# Patient Record
Sex: Male | Born: 1980 | Race: Black or African American | Hispanic: No | Marital: Married | Smoking: Never smoker
Health system: Southern US, Community
[De-identification: ages and names within clinical notes are randomized; demographics above are authoritative.]

## PROBLEM LIST (undated history)

## (undated) DIAGNOSIS — L659 Nonscarring hair loss, unspecified: Secondary | ICD-10-CM

## (undated) HISTORY — PX: ESOPHAGUS SURGERY: SHX626

## (undated) HISTORY — DX: Nonscarring hair loss, unspecified: L65.9

---

## 2003-09-01 ENCOUNTER — Emergency Department (HOSPITAL_COMMUNITY): Admission: EM | Admit: 2003-09-01 | Discharge: 2003-09-02 | Payer: Self-pay | Admitting: Emergency Medicine

## 2004-07-02 ENCOUNTER — Emergency Department (HOSPITAL_COMMUNITY): Admission: EM | Admit: 2004-07-02 | Discharge: 2004-07-03 | Payer: Self-pay | Admitting: Emergency Medicine

## 2005-07-15 ENCOUNTER — Emergency Department (HOSPITAL_COMMUNITY): Admission: EM | Admit: 2005-07-15 | Discharge: 2005-07-15 | Payer: Self-pay | Admitting: Family Medicine

## 2012-02-08 ENCOUNTER — Encounter: Payer: Self-pay | Admitting: Medical

## 2012-03-10 ENCOUNTER — Encounter: Payer: Self-pay | Admitting: Medical

## 2012-03-10 ENCOUNTER — Telehealth: Payer: Self-pay | Admitting: Family Medicine

## 2012-03-10 ENCOUNTER — Ambulatory Visit (INDEPENDENT_AMBULATORY_CARE_PROVIDER_SITE_OTHER): Payer: 59 | Admitting: Medical

## 2012-03-10 VITALS — BP 130/80 | HR 80 | Temp 97.9°F | Resp 16 | Ht 66.0 in | Wt 174.0 lb

## 2012-03-10 DIAGNOSIS — J310 Chronic rhinitis: Secondary | ICD-10-CM

## 2012-03-10 DIAGNOSIS — S025XXA Fracture of tooth (traumatic), initial encounter for closed fracture: Secondary | ICD-10-CM

## 2012-03-10 DIAGNOSIS — H5462 Unqualified visual loss, left eye, normal vision right eye: Secondary | ICD-10-CM

## 2012-03-10 DIAGNOSIS — Z23 Encounter for immunization: Secondary | ICD-10-CM

## 2012-03-10 DIAGNOSIS — M25512 Pain in left shoulder: Secondary | ICD-10-CM

## 2012-03-10 DIAGNOSIS — B353 Tinea pedis: Secondary | ICD-10-CM

## 2012-03-10 DIAGNOSIS — M25519 Pain in unspecified shoulder: Secondary | ICD-10-CM

## 2012-03-10 DIAGNOSIS — Z0000 Encounter for general adult medical examination without abnormal findings: Secondary | ICD-10-CM

## 2012-03-10 DIAGNOSIS — H5460 Unqualified visual loss, one eye, unspecified: Secondary | ICD-10-CM

## 2012-03-10 LAB — COMPREHENSIVE METABOLIC PANEL
ALT: 16 U/L (ref 0–53)
Alkaline Phosphatase: 44 U/L (ref 39–117)
Creat: 1.11 mg/dL (ref 0.50–1.35)
Glucose, Bld: 92 mg/dL (ref 70–99)
Sodium: 137 mEq/L (ref 135–145)
Total Protein: 7.1 g/dL (ref 6.0–8.3)

## 2012-03-10 LAB — CBC WITH DIFFERENTIAL/PLATELET
Eosinophils Absolute: 0.6 10*3/uL (ref 0.0–0.7)
Eosinophils Relative: 9 % — ABNORMAL HIGH (ref 0–5)
Lymphs Abs: 2.2 10*3/uL (ref 0.7–4.0)
MCHC: 34.8 g/dL (ref 30.0–36.0)
Monocytes Absolute: 0.4 10*3/uL (ref 0.1–1.0)
Monocytes Relative: 7 % (ref 3–12)
RDW: 13.5 % (ref 11.5–15.5)
WBC: 6 10*3/uL (ref 4.0–10.5)

## 2012-03-10 LAB — LIPID PANEL
Cholesterol: 160 mg/dL (ref 0–200)
LDL Cholesterol: 98 mg/dL (ref 0–99)
Triglycerides: 124 mg/dL (ref <150)
VLDL: 25 mg/dL (ref 0–40)

## 2012-03-10 LAB — TSH: TSH: 2.525 u[IU]/mL (ref 0.350–4.500)

## 2012-03-10 MED ORDER — TERBINAFINE HCL 1 % EX CREA: TOPICAL_CREAM | Freq: Two times a day (BID) | CUTANEOUS | Status: DC

## 2012-03-10 MED ORDER — FLUTICASONE PROPIONATE 50 MCG/ACT NA SUSP: 2.0000 | Freq: Every day | NASAL | Status: DC

## 2012-03-10 NOTE — Telephone Encounter (Signed)
Patient has an appointment with Dr. Harriette Bouillon on 03/17/12 @ 915 am. CLS [phone]

## 2012-03-10 NOTE — Addendum Note (Signed)
Addended by: Leretha Dykes L on: 03/10/2012 10:53 AM   Modules accepted: Orders

## 2012-03-10 NOTE — Progress Notes (Signed)
Subjective:   HPI  Dustin Bruce is a 31 y.o. male who presents for a complete physical.  New patient today, no prior PCM since childhood.  Preventative care: Last ophthalmology visit:n/a Last dental visit:few years ago Last colonoscopy:n/a Last prostate exam: n/a Last EKG:n/a Last labs/a:  Prior vaccinations: TD or Tdap:n/a Influenza: declines Pneumococcal:n/a Shingles/Zostavax:n/a  Advanced directive:n/a Health care power of attorney:n/a Living will:yes  Reviewed their medical, surgical, family, social, medication, and allergy history and updated chart as appropriate.  Concerns: 1) gets pains in left arm.  Injured arm playing basketball years ago.  Sometimes the way he lifts or moves arm sometimes gives pain.  2) every few months loses sight in left eye.  Seems grayish, and vision will return gradually over a few minutes.  Denies headaches, dizziness, hearing loss, no numbness and tingling.  Denies chest pain  3) foot concern between toes  History reviewed. No pertinent past medical history.  Past Surgical History  Procedure Date  . Esophagus surgery     age 80;    Family History  Problem Relation Age of Onset  . Cancer Neg Hx   . Heart disease Neg Hx   . Hypertension Neg Hx   . Diabetes Neg Hx   . Stroke Neg Hx     History   Social History  . Marital Status: Married    Spouse Name: N/A    Number of Children: N/A  . Years of Education: N/A   Occupational History  . Not on file.   Social History Main Topics  . Smoking status: Never Smoker   . Smokeless tobacco: Not on file  . Alcohol Use: Yes     Comment: occasionally  . Drug Use: No  . Sexually Active: Not on file   Other Topics Concern  . Not on file   Social History Narrative   Married, has 2 children, ages 25yo and 13yo, works in [employer], exercise - infrequently    No current outpatient prescriptions on file prior to visit.    No Known Allergies   Review of  Systems Constitutional: -fever, -chills, -sweats, -unexpected weight change, -decreased appetite, -fatigue Allergy: +sneezing, -itching, -congestion Dermatology: -changing moles, --rash, -lumps ENT: +runny nose, -ear pain, -sore throat, -hoarseness, -sinus pain, -teeth pain, - ringing in ears, -hearing loss, -nosebleeds Cardiology: -chest pain, -palpitations, -swelling, -difficulty breathing when lying flat, -waking up short of breath Respiratory: -cough, -shortness of breath, -difficulty breathing with exercise or exertion, -wheezing, -coughing up blood Gastroenterology: -abdominal pain, -nausea, -vomiting, -diarrhea, -constipation, -blood in stool, -changes in bowel movement, -difficulty swallowing or eating Hematology: -bleeding, -bruising  Musculoskeletal: -joint aches, +muscle aches, -joint swelling, -back pain, -neck pain, -cramping, -changes in gait Ophthalmology: + vision changes, eye redness, itching, discharge Urology: -burning with urination, -difficulty urinating, -blood in urine, -urinary frequency, -urgency, -incontinence Neurology: -headache, -weakness, -tingling, -numbness, -memory loss, -falls, -dizziness Psychology: -depressed mood, -agitation, -sleep problems     Objective:   Physical Exam  Filed Vitals:   03/10/12 0854  Pulse: 80  Temp: 97.9 F (36.6 C)  Resp: 16    General appearance: alert, no distress, WD/WN, AA male Skin: no worrisome lesions, few benign appearing macules HEENT: normocephalic, conjunctiva/corneas normal, sclerae anicteric, PERRLA, EOMi, nares patent, no discharge or erythema, pharynx normal Oral cavity: MMM, tongue normal, right posterior molars with obvious fractures, moderate plaque Neck: supple, no lymphadenopathy, no thyromegaly, no masses, normal ROM, on bruits Chest: non tender, normal shape and expansion  Heart: RRR, normal S1, S2, no murmurs Lungs: CTA bilaterally, no wheezes, rhonchi, or rales Abdomen: +bs, soft, non tender, non  distended, no masses, no hepatomegaly, no splenomegaly, no bruits Back: non tender, normal ROM, no scoliosis Musculoskeletal: +tender left upper lateral arm, left shoulder with reduced internal ROM, pain with neers and hawkins, pain with empty can test, but otherwise normal ROM of shoulder,  Otherwise upper extremities non tender, no obvious deformity, normal ROM throughout, lower extremities non tender, no obvious deformity, normal ROM throughout Extremities: no edema, no cyanosis, no clubbing Pulses: 2+ symmetric, upper and lower extremities, normal cap refill Neurological: alert, oriented x 3, CN2-12 intact, strength normal upper extremities and lower extremities, sensation normal throughout, DTRs 2+ throughout, no cerebellar signs, gait normal Psychiatric: normal affect, behavior normal, pleasant  GU: normal male external genitalia, circumcised, nontender, no masses, no hernia, no lymphadenopathy Rectal: deferred   Assessment and Plan :    Encounter Diagnoses  Name Primary?  . Routine general medical examination at a health care facility Yes  . Tooth fracture   . Tinea pedis   . Rhinitis, chronic   . Vision loss of left eye   . Need for Tdap vaccination   . Need for influenza vaccination   . Left shoulder pain      Physical exam - discussed healthy lifestyle, diet, exercise, preventative care, vaccinations, and addressed their concerns.    Advised dental f/u for tooth fracture  Tinea pedis - begin Lamisil, wear open toed shoes at home to let them breath.  Recheck 71mo  chronic rhinitis - begin fluticasone  Loss of vision - etiology unclear.  Referral to Dr. Harriette Bouillon.  Neuro exam normal, clock drawing normal.  tdap vaccine, VIS and counseling given  Flu vaccine, VIS and counseling given  Left shoulder pain - likely rotator cuff problem.  Will send for xray, and likely subsequent PT   Follow-up pending labs, referral

## 2012-03-10 NOTE — Patient Instructions (Signed)
Begin foot fungus cream twice daily for the next 2-4 weeks.  Begin fluticasone nasal spray, 2 sprays daily in the morning for nasal congestion.  Use daily.    I recommend [phone] Dr. Dondra Prader for tooth repair  We referred you to eye doctor.  We will send you for xray of the left shoulder.   Preventative Care for Adults, Male       REGULAR HEALTH EXAMS:  A routine yearly physical is a good way to check in with your primary care provider about your health and preventive screening. It is also an opportunity to share updates about your health and any concerns you have, and receive a thorough all-over exam.   Most health insurance companies pay for at least some preventative services.  Check with your health plan for specific coverages.  WHAT PREVENTATIVE SERVICES DO MEN NEED?  Adult men should have their weight and blood pressure checked regularly.   Men age 56 and older should have their cholesterol levels checked regularly.  Beginning at age 55 and continuing to age 51, men should be screened for colorectal cancer.  Certain people should may need continued testing until age 66.  Other cancer screening may include exams for testicular and prostate cancer.  Updating vaccinations is part of preventative care.  Vaccinations help protect against diseases such as the flu.  Lab tests are generally done as part of preventative care to screen for anemia and blood disorders, to screen for problems with the kidneys and liver, to screen for bladder problems, to check blood sugar, and to check your cholesterol level.  Preventative services generally include counseling about diet, exercise, avoiding tobacco, drugs, excessive alcohol consumption, and sexually transmitted infections.    GENERAL RECOMMENDATIONS FOR GOOD HEALTH:  Healthy diet:  Eat a variety of foods, including fruit, vegetables, animal or vegetable protein, such as meat, fish, chicken, and eggs, or beans, lentils, tofu, and  grains, such as rice.  Drink plenty of water daily.  Decrease saturated fat in the diet, avoid lots of red meat, processed foods, sweets, fast foods, and fried foods.  Exercise:  Aerobic exercise helps maintain good heart health. At least 30-40 minutes of moderate-intensity exercise is recommended. For example, a brisk walk that increases your heart rate and breathing. This should be done on most days of the week.   Find a type of exercise or a variety of exercises that you enjoy so that it becomes a part of your daily life.  Examples are running, walking, swimming, water aerobics, and biking.  For motivation and support, explore group exercise such as aerobic class, spin class, Zumba, Yoga,or  martial arts, etc.    Set exercise goals for yourself, such as a certain weight goal, walk or run in a race such as a 5k walk/run.  Speak to your primary care provider about exercise goals.  Disease prevention:  If you smoke or chew tobacco, find out from your caregiver how to quit. It can literally save your life, no matter how long you have been a tobacco user. If you do not use tobacco, never begin.   Maintain a healthy diet and normal weight. Increased weight leads to problems with blood pressure and diabetes.   The Body Mass Index or BMI is a way of measuring how much of your body is fat. Having a BMI above 27 increases the risk of heart disease, diabetes, hypertension, stroke and other problems related to obesity. Your caregiver can help determine your BMI and based  on it develop an exercise and dietary program to help you achieve or maintain this important measurement at a healthful level.  High blood pressure causes heart and blood vessel problems.  Persistent high blood pressure should be treated with medicine if weight loss and exercise do not work.   Fat and cholesterol leaves deposits in your arteries that can block them. This causes heart disease and vessel disease elsewhere in your body.   If your cholesterol is found to be high, or if you have heart disease or certain other medical conditions, then you may need to have your cholesterol monitored frequently and be treated with medication.   Ask if you should have a stress test if your history suggests this. A stress test is a test done on a treadmill that looks for heart disease. This test can find disease prior to there being a problem.  Avoid drinking alcohol in excess (more than two drinks per day).  Avoid use of street drugs. Do not share needles with anyone. Ask for professional help if you need assistance or instructions on stopping the use of alcohol, cigarettes, and/or drugs.  Brush your teeth twice a day with fluoride toothpaste, and floss once a day. Good oral hygiene prevents tooth decay and gum disease. The problems can be painful, unattractive, and can cause other health problems. Visit your dentist for a routine oral and dental check up and preventive care every 6-12 months.   Look at your skin regularly.  Use a mirror to look at your back. Notify your caregivers of changes in moles, especially if there are changes in shapes, colors, a size larger than a pencil eraser, an irregular border, or development of new moles.  Safety:  Use seatbelts 100% of the time, whether driving or as a passenger.  Use safety devices such as hearing protection if you work in environments with loud noise or significant background noise.  Use safety glasses when doing any work that could send debris in to the eyes.  Use a helmet if you ride a bike or motorcycle.  Use appropriate safety gear for contact sports.  Talk to your caregiver about gun safety.  Use sunscreen with a SPF (or skin protection factor) of 15 or greater.  Lighter skinned people are at a greater risk of skin cancer. Don't forget to also wear sunglasses in order to protect your eyes from too much damaging sunlight. Damaging sunlight can accelerate cataract formation.   Practice  safe sex. Use condoms. Condoms are used for birth control and to help reduce the spread of sexually transmitted infections (or STIs).  Some of the STIs are gonorrhea (the clap), chlamydia, syphilis, trichomonas, herpes, HPV (human papilloma virus) and HIV (human immunodeficiency virus) which causes AIDS. The herpes, HIV and HPV are viral illnesses that have no cure. These can result in disability, cancer and death.   Keep carbon monoxide and smoke detectors in your home functioning at all times. Change the batteries every 6 months or use a model that plugs into the wall.   Vaccinations:  Stay up to date with your tetanus shots and other required immunizations. You should have a booster for tetanus every 10 years. Be sure to get your flu shot every year, since 5%-20% of the [place] population comes down with the flu. The flu vaccine changes each year, so being vaccinated once is not enough. Get your shot in the fall, before the flu season peaks.   Other vaccines to consider:  Pneumococcal vaccine  to protect against certain types of pneumonia.  This is normally recommended for adults age 74 or older.  However, adults younger than 34 years old with certain underlying conditions such as diabetes, heart or lung disease should also receive the vaccine.  Shingles vaccine to protect against Varicella Zoster if you are older than age 55, or younger than 33 years old with certain underlying illness.  Hepatitis A vaccine to protect against a form of infection of the liver by a virus acquired from food.  Hepatitis B vaccine to protect against a form of infection of the liver by a virus acquired from blood or body fluids, particularly if you work in health care.  If you plan to travel internationally, check with your local health department for specific vaccination recommendations.  Cancer Screening:  Most routine colon cancer screening begins at the age of 38. On a yearly basis, doctors may provide special  easy to use take-home tests to check for hidden blood in the stool. Sigmoidoscopy or colonoscopy can detect the earliest forms of colon cancer and is life saving. These tests use a small camera at the end of a tube to directly examine the colon. Speak to your caregiver about this at age 37, when routine screening begins (and is repeated every 5 years unless early forms of pre-cancerous polyps or small growths are found).   At the age of 63 men usually start screening for prostate cancer every year. Screening may begin at a younger age for those with higher risk. Those at higher risk include African-Americans or having a family history of prostate cancer. There are two types of tests for prostate cancer:   Prostate-specific antigen (PSA) testing. Recent studies raise questions about prostate cancer using PSA and you should discuss this with your caregiver.   Digital rectal exam (in which your doctor's lubricated and gloved finger feels for enlargement of the prostate through the anus).   Screening for testicular cancer.  Do a monthly exam of your testicles. Gently roll each testicle between your thumb and fingers, feeling for any abnormal lumps. The best time to do this is after a hot shower or bath when the tissues are looser. Notify your caregivers of any lumps, tenderness or changes in size or shape immediately.

## 2012-03-14 ENCOUNTER — Other Ambulatory Visit: Payer: Self-pay | Admitting: Medical

## 2012-03-14 DIAGNOSIS — H5462 Unqualified visual loss, left eye, normal vision right eye: Secondary | ICD-10-CM

## 2012-03-28 ENCOUNTER — Other Ambulatory Visit: Payer: Self-pay | Admitting: Family Medicine

## 2012-03-28 ENCOUNTER — Telehealth: Payer: Self-pay | Admitting: Family Medicine

## 2012-03-28 NOTE — Telephone Encounter (Signed)
I left a message about the patients appointment at [facility] on 04/03/12 @ 200 pm. CLS

## 2012-03-31 ENCOUNTER — Ambulatory Visit
Admission: RE | Admit: 2012-03-31 | Discharge: 2012-03-31 | Disposition: A | Discharge status: Home / Self Care | Payer: 59 | Source: Ambulatory Visit | Attending: Medical | Admitting: Medical

## 2012-03-31 DIAGNOSIS — M25512 Pain in left shoulder: Secondary | ICD-10-CM

## 2012-03-31 DIAGNOSIS — H5462 Unqualified visual loss, left eye, normal vision right eye: Secondary | ICD-10-CM

## 2012-04-03 ENCOUNTER — Other Ambulatory Visit: Payer: 59

## 2012-04-12 ENCOUNTER — Encounter: Payer: Self-pay | Admitting: Medical

## 2012-04-12 ENCOUNTER — Ambulatory Visit (INDEPENDENT_AMBULATORY_CARE_PROVIDER_SITE_OTHER): Payer: 59 | Admitting: Medical

## 2012-04-12 VITALS — BP 130/80 | HR 80 | Temp 98.0°F | Resp 16 | Wt 174.0 lb

## 2012-04-12 DIAGNOSIS — M25512 Pain in left shoulder: Secondary | ICD-10-CM

## 2012-04-12 DIAGNOSIS — M25519 Pain in unspecified shoulder: Secondary | ICD-10-CM

## 2012-04-12 DIAGNOSIS — H5462 Unqualified visual loss, left eye, normal vision right eye: Secondary | ICD-10-CM

## 2012-04-12 DIAGNOSIS — E041 Nontoxic single thyroid nodule: Secondary | ICD-10-CM

## 2012-04-12 DIAGNOSIS — B353 Tinea pedis: Secondary | ICD-10-CM

## 2012-04-12 DIAGNOSIS — J309 Allergic rhinitis, unspecified: Secondary | ICD-10-CM

## 2012-04-12 DIAGNOSIS — H5460 Unqualified visual loss, one eye, unspecified: Secondary | ICD-10-CM

## 2012-04-12 NOTE — Progress Notes (Signed)
Subjective:   HPI  Dustin Bruce is a 31 y.o. male who presents for f/u.  i saw him recently for physical.   He is here to discus labs, studies, the concerns that were raised, and plan.    The main concern was vision loss.  Gets vision loss in left eye every few months this year only.   Has happened a few other times since last visit.  Seems grayish, and vision will return gradually over a few minutes.  Denies headaches, dizziness, hearing loss, no numbness and tingling.  Denies chest pain  He is using the Lamisil for foot fungus.   Has seen significant improvement in nasal congestion on fluticasone.  No past medical history on file.  Family History  Problem Relation Age of Onset  . Cancer Neg Hx   . Heart disease Neg Hx   . Hypertension Neg Hx   . Diabetes Neg Hx   . Stroke Neg Hx       Objective:   Physical Exam  Filed Vitals:   04/12/12 1015  BP: 130/80  Pulse: 80  Temp: 98 F (36.7 C)  Resp: 16    General appearance: alert, no distress, WD/WN, AA male Neck: supple, no lymphadenopathy, left thyroid nodule palpable approx 1 cm somewhat oval, seems somewhat mobile.  Normal ROM Musculoskeletal: +tender left arm anterior humerus/biceps origin, tender left AC joint, mildly limited left internal shoulder ROM, +hawkins on the left, otherwise nontender, negative other special tests, ROM otherwise full Extremities: no edema, no cyanosis, no clubbing Pulses: 2+ symmetric, upper and lower extremities, normal cap refill Neurological: alert, oriented x 3, CN2-12 intact, strength normal upper extremities and lower extremities, sensation normal throughout, DTRs 2+ throughout, no cerebellar signs, gait normal Psychiatric: normal affect, behavior normal, pleasant    Assessment and Plan :    Encounter Diagnoses  Name Primary?  . Vision loss of left eye Yes  . Thyroid nodule   . Tinea pedis   . Left shoulder pain   . Allergic rhinitis      Vision loss, transient, left eye -  neurology referral  Thyroid nodule - will set up for thyroid ultrasound   Tinea pedis - c/t Lamisil, wear open toed shoes at home to let them breath.   Left shoulder pain - demonstrated and discussed shoulder rehab home exercises for the next month to re strengthen  Allergic rhinitis, likely vasomotor rhinitis as well - much improved on fluticasone

## 2012-04-28 ENCOUNTER — Telehealth: Payer: Self-pay | Admitting: Family Medicine

## 2012-04-28 NOTE — Telephone Encounter (Signed)
Patient is aware of his appointment at [facility] on 05/01/12 @ 1115 am for his thyroid U/S. [phone]. CLS

## 2012-05-01 ENCOUNTER — Other Ambulatory Visit: Payer: 59

## 2012-05-05 ENCOUNTER — Other Ambulatory Visit: Payer: 59

## 2013-10-31 ENCOUNTER — Emergency Department (HOSPITAL_COMMUNITY)
Admission: EM | Admit: 2013-10-31 | Discharge: 2013-10-31 | Disposition: A | Discharge status: Home / Self Care | Payer: BC Managed Care – PPO | Attending: Emergency Medicine | Admitting: Emergency Medicine

## 2013-10-31 ENCOUNTER — Emergency Department (HOSPITAL_COMMUNITY): Payer: BC Managed Care – PPO

## 2013-10-31 ENCOUNTER — Encounter (HOSPITAL_COMMUNITY): Payer: Self-pay | Admitting: Emergency Medicine

## 2013-10-31 ENCOUNTER — Emergency Department (HOSPITAL_COMMUNITY)
Admission: EM | Admit: 2013-10-31 | Discharge: 2013-10-31 | Disposition: A | Discharge status: Nursing Home (Includes ICF) | Payer: BC Managed Care – PPO | Source: Home / Self Care | Attending: Emergency Medicine | Admitting: Emergency Medicine

## 2013-10-31 DIAGNOSIS — M542 Cervicalgia: Secondary | ICD-10-CM | POA: Insufficient documentation

## 2013-10-31 DIAGNOSIS — R0602 Shortness of breath: Secondary | ICD-10-CM | POA: Insufficient documentation

## 2013-10-31 DIAGNOSIS — R079 Chest pain, unspecified: Secondary | ICD-10-CM | POA: Insufficient documentation

## 2013-10-31 LAB — CBC
HCT: 45.5 % (ref 39.0–52.0)
Hemoglobin: 15.8 g/dL (ref 13.0–17.0)
MCH: 30 pg (ref 26.0–34.0)
MCHC: 34.7 g/dL (ref 30.0–36.0)
MCV: 86.5 fL (ref 78.0–100.0)
Platelets: 175 10*3/uL (ref 150–400)
RBC: 5.26 MIL/uL (ref 4.22–5.81)
RDW: 12.4 % (ref 11.5–15.5)
WBC: 6.3 10*3/uL (ref 4.0–10.5)

## 2013-10-31 LAB — TROPONIN I: Troponin I: <0.3 ng/mL (ref <0.30)

## 2013-10-31 LAB — BASIC METABOLIC PANEL
Anion gap: 16 — ABNORMAL HIGH (ref 5–15)
BUN: 12 mg/dL (ref 6–23)
CO2: 22 mEq/L (ref 19–32)
Calcium: 9.5 mg/dL (ref 8.4–10.5)
Chloride: 104 mEq/L (ref 96–112)
Creatinine, Ser: 0.97 mg/dL (ref 0.50–1.35)
GFR calc Af Amer: >90 mL/min (ref >90)
GFR calc non Af Amer: >90 mL/min (ref >90)
Glucose, Bld: 88 mg/dL (ref 70–99)
Potassium: 3.8 mEq/L (ref 3.7–5.3)
Sodium: 142 mEq/L (ref 137–147)

## 2013-10-31 MED ORDER — ASPIRIN 81 MG PO CHEW
CHEWABLE_TABLET | ORAL | Status: AC
  Filled 2013-10-31: qty 4

## 2013-10-31 MED ORDER — KETOROLAC TROMETHAMINE 30 MG/ML IJ SOLN
30.0000 mg | Freq: Once | INTRAMUSCULAR | Status: AC
  Administered 2013-10-31: 30 mg via INTRAVENOUS
  Filled 2013-10-31: qty 1

## 2013-10-31 MED ORDER — ASPIRIN 81 MG PO CHEW
CHEWABLE_TABLET | ORAL | Status: AC
  Filled 2013-10-31: qty 1

## 2013-10-31 MED ORDER — CYCLOBENZAPRINE HCL 10 MG PO TABS
10.0000 mg | ORAL_TABLET | Freq: Once | ORAL | Status: AC
  Administered 2013-10-31: 10 mg via ORAL
  Filled 2013-10-31: qty 1

## 2013-10-31 MED ORDER — NITROGLYCERIN 0.4 MG SL SUBL
SUBLINGUAL_TABLET | SUBLINGUAL | Status: AC
  Filled 2013-10-31: qty 1

## 2013-10-31 MED ORDER — ASPIRIN 81 MG PO CHEW
324.0000 mg | CHEWABLE_TABLET | Freq: Once | ORAL | Status: AC
  Administered 2013-10-31: 324 mg via ORAL

## 2013-10-31 MED ORDER — CYCLOBENZAPRINE HCL 10 MG PO TABS: 10.0000 mg | ORAL_TABLET | Freq: Two times a day (BID) | ORAL | Status: DC | PRN

## 2013-10-31 MED ORDER — SODIUM CHLORIDE 0.9 % IV SOLN
Freq: Once | INTRAVENOUS | Status: AC
  Administered 2013-10-31: 16:00:00 via INTRAVENOUS

## 2013-10-31 MED ORDER — TRAMADOL HCL 50 MG PO TABS: 50.0000 mg | ORAL_TABLET | Freq: Four times a day (QID) | ORAL | Status: DC | PRN

## 2013-10-31 MED ORDER — KETOROLAC TROMETHAMINE 60 MG/2ML IM SOLN: 60.0000 mg | Freq: Once | INTRAMUSCULAR | Status: DC

## 2013-10-31 MED ORDER — NITROGLYCERIN 0.4 MG SL SUBL
0.4000 mg | SUBLINGUAL_TABLET | SUBLINGUAL | Status: DC | PRN
Start: 2013-10-31 — End: 2013-10-31

## 2013-10-31 NOTE — ED Notes (Signed)
Pt states L sided neck pain radiating to shoulder and into chest. Onset yesterday. Denies pain at the time. States neck feels sore with movement. Pt denies injury. He is alert and oriented x4. Respirations unlabored.

## 2013-10-31 NOTE — ED Notes (Signed)
[facility] sided neck pain radiating to shoulder and into chest. Ongoing since Tuesday morning. Neck pain increases with movement. Denies recent injury. Pt is alert and oriented x4.

## 2013-10-31 NOTE — ED Notes (Signed)
Notified [facility] 

## 2013-10-31 NOTE — ED Provider Notes (Signed)
CSN: [id number]     Arrival date & time 10/31/13  1556 History   First MD Initiated Contact with Patient 10/31/13 1605     Chief Complaint  Patient presents with  . Shoulder Pain  . Chest Pain     (Consider location/radiation/quality/duration/timing/severity/associated sxs/prior Treatment) HPI Pt is a 33yo male sent to ED from urgent care for further evaluation of c/o intermittent left sided chest pain and SOB associated with left sided neck pain that started yesterday morning. Pt states he woke up with left sided neck pain. Denies hx of trauma.  Per urgent care, neck pain appears muscular in nature, however CP was more concerning to pt and would like to make sure his heart is not the cause of his pain.  Pt states chest pain is "maybe a 1/10" at this time in the ED in center of chest and denies SOB.  Left sided neck pain is constant, aching and sore, worse with head movement and palpation, 6/10 at worst.  Reports taking ibuprofen with minimal relief. Denies fever, n/v/d. Denies personal hx of CAD. Reports FHx of grandfather died from MI at age 35350.  Pt denies any other significant PMH.   History reviewed. No pertinent past medical history. Past Surgical History  Procedure Laterality Date  . Esophagus surgery      age 33;   Family History  Problem Relation Age of Onset  . Cancer Neg Hx   . Heart disease Neg Hx   . Hypertension Neg Hx   . Diabetes Neg Hx   . Stroke Neg Hx    History  Substance Use Topics  . Smoking status: Never Smoker   . Smokeless tobacco: Not on file  . Alcohol Use: Yes     Comment: social    Review of Systems  Constitutional: Negative for fever, chills, diaphoresis and fatigue.  HENT: Negative for congestion and sore throat.   Respiratory: Positive for shortness of breath. Negative for cough, wheezing and stridor.   Cardiovascular: Positive for chest pain. Negative for palpitations and leg swelling.  Gastrointestinal: Negative for nausea, vomiting, abdominal  pain and diarrhea.  All other systems reviewed and are negative.     Allergies  Review of patient's allergies indicates no known allergies.  Home Medications   Prior to Admission medications   Medication Sig Start Date End Date Taking? Authorizing Provider  ibuprofen (ADVIL,MOTRIN) 200 MG tablet Take 400 mg by mouth every 6 (six) hours as needed for mild pain.   Yes Historical Provider, MD  cyclobenzaprine (FLEXERIL) 10 MG tablet Take 1 tablet (10 mg total) by mouth 2 (two) times daily as needed for muscle spasms. 10/31/13   Junius FinnerErin O'Malley, PA-C  traMADol (ULTRAM) 50 MG tablet Take 1 tablet (50 mg total) by mouth every 6 (six) hours as needed. 10/31/13   Junius FinnerErin O'Malley, PA-C   BP 132/84  Pulse 66  Temp(Src) 97.9 F (36.6 C) (Oral)  Resp 15  SpO2 99% Physical Exam  Nursing note and vitals reviewed. Constitutional: He appears well-developed and well-nourished.  Pt lying in exam bed, appears mildly anxious  HENT:  Head: Normocephalic and atraumatic.  Eyes: Conjunctivae are normal. No scleral icterus.  Neck: Normal range of motion. Neck supple. No JVD present. No tracheal deviation present. No thyromegaly present.  No midline spinal tenderness. No meningeal signs. Tenderness in left paraspinal muscles   Cardiovascular: Normal rate, regular rhythm and normal heart sounds.   Pulmonary/Chest: Effort normal and breath sounds normal. No stridor. No respiratory  distress. He has no wheezes. He has no rales. He exhibits no tenderness.  No respiratory distress, able to speak in full sentences w/o difficulty. Lungs: CTAB. No chest wall tenderness   Abdominal: Soft. Bowel sounds are normal. He exhibits no distension and no mass. There is no tenderness. There is no rebound and no guarding.  Musculoskeletal: Normal range of motion. He exhibits tenderness. He exhibits no edema.  Tenderness in left upper trapezius. No shoulder deformity. FROM left arm.   Lymphadenopathy:    He has no cervical  adenopathy.  Neurological: He is alert.  Skin: Skin is warm and dry.    ED Course  Procedures (including critical care time) Labs Review Labs Reviewed  BASIC METABOLIC PANEL - Abnormal; Notable for the following:    Anion gap 16 (*)    All other components within normal limits  CBC  TROPONIN I    Imaging Review Dg Chest 2 View  10/31/2013   CLINICAL DATA:  Left chest pain radiating to the left shoulder, some shortness of breath  EXAM: CHEST  2 VIEW  COMPARISON:  Chest x-ray of 07/03/2004  FINDINGS: No active infiltrate or effusion is seen. There is mild peribronchial thickening present which may indicate bronchitis. Mediastinal and hilar contours are unremarkable. The heart is within normal limits in size. No bony abnormality is seen.  IMPRESSION: No active infiltrate or effusion. Mild peribronchial thickening may indicate bronchitis.   Electronically Signed   By: Dwyane DeePaul  Barry M.D.   On: 10/31/2013 17:19     EKG Interpretation None      MDM   Final diagnoses:  Neck pain on left side  Chest pain, unspecified chest pain type    Pt is a 33yo male sent to ED for further evaluation of neck pain, with chest pain and SOB.  While in ED pt states chest pain is "maybe a 1/10" and denies SOB.  Pt states neck pain still present.  On exam, neck pain is reproducible with palpation and head movements.  Likely muscular in nature.  No chest wall tenderness. No respiratory distress. Lungs: CTAB.  Low concern for ACS or PE.  EKG from urgent care reviewed: NSR, normal EKG.  Troponin: negative for elevation. CBC and BMP: WNL.  Pt given flexerion and toradol which did help neck pain some, pain dropped from 6/10 to 3/10.  Not concerned for emergent process taking place at this time.  Will discharge pt home to f/u with PCP. Return precautions provided. Pt verbalized understanding and agreement with tx plan.      Junius Finnerrin O'Malley, PA-C 11/01/13 1535

## 2013-10-31 NOTE — ED Notes (Addendum)
Left neck pain, limiting the turning of his head.  Onset Tuesday am when he woke.  Pain radiates into top of left chest and across left should.  Pain not as bad today as yesterday.  Patient reports chest pain was worse and felt some sob, unable to get comfortable, and then fell asleep

## 2013-10-31 NOTE — ED Provider Notes (Signed)
CSN: [id number]     Arrival date & time 10/31/13  1206 History   First MD Initiated Contact with Patient 10/31/13 1357     Chief Complaint  Patient presents with  . Neck Pain   (Consider location/radiation/quality/duration/timing/severity/associated sxs/prior Treatment) HPI Comments: Patient presents stating he woke with left lateral neck pain on the morning of 10/30/2013. States pain persisted throughout the day on 7/7 and on the afternoon of 7/7 he then developed left shoulder pain and left anterior chest pain with associated dyspnea. States neck pain has persisted and is exacerbated by movement of his head or turning head from side to side. States chest pain has waxed and waned over the past 24 hours and does not seem to change with movement of his head, neck, torso or upper extremities. States when chest pain intensifies, he develops dyspnea. Cannot determine if chest symptoms are exacerbated by exertion or if they improve with rest. Denies fever or cough. Denies recent injury. Has been taking OTC ibuprofen with little relief.  [facility] Does not smoke, drink alcohol or use illicit drugs. Fhx: father with DM, grandfather died of fatal MI at age 35  The history is provided by the patient.    History reviewed. No pertinent past medical history. Past Surgical History  Procedure Laterality Date  . Esophagus surgery      age 68;   Family History  Problem Relation Age of Onset  . Cancer Neg Hx   . Heart disease Neg Hx   . Hypertension Neg Hx   . Diabetes Neg Hx   . Stroke Neg Hx    History  Substance Use Topics  . Smoking status: Never Smoker   . Smokeless tobacco: Not on file  . Alcohol Use: Yes     Comment: occasionally    Review of Systems  Constitutional: Negative.   HENT: Negative.   Eyes: Negative.   Respiratory: Positive for chest tightness and shortness of breath. Negative for cough and wheezing.   Cardiovascular: Positive for chest pain. Negative for  palpitations and leg swelling.  Gastrointestinal: Negative.   Genitourinary: Negative.   Musculoskeletal: Positive for neck pain and neck stiffness.  Skin: Negative.   Neurological: Negative.     Allergies  Review of patient's allergies indicates no known allergies.  Home Medications   Prior to Admission medications   Medication Sig Start Date End Date Taking? Authorizing Provider  fluticasone (FLONASE) 50 MCG/ACT nasal spray Place 2 sprays into the nose daily. 03/10/12   Kermit Baloavid S Tysinger, PA-C  terbinafine (LAMISIL AT) 1 % cream Apply topically 2 (two) times daily. 03/10/12   Kermit Baloavid S Tysinger, PA-C   BP 135/90  Pulse 78  Temp(Src) 98.1 F (36.7 C) (Oral)  Resp 14  SpO2 100% Physical Exam  Nursing note and vitals reviewed. Constitutional: He is oriented to person, place, and time. He appears well-developed and well-nourished. No distress.  HENT:  Head: Normocephalic and atraumatic.  Eyes: Conjunctivae are normal.  Neck: Normal range of motion and phonation normal. Neck supple. No JVD present. Muscular tenderness present. No spinous process tenderness present. No rigidity. Normal range of motion present.    Outlined area is area of tenderness with palpation and ROM of neck/head  Cardiovascular: Normal rate, regular rhythm and normal heart sounds.   Pulmonary/Chest: Effort normal and breath sounds normal. No respiratory distress. He has no wheezes. He exhibits no tenderness.  Musculoskeletal: Normal range of motion.  Neurological: He is alert and oriented to  person, place, and time.  Skin: Skin is warm and dry. No rash noted. No erythema.  Psychiatric: He has a normal mood and affect. His behavior is normal.    ED Course  Procedures (including critical care time) Labs Review Labs Reviewed - No data to display  Imaging Review No results found.   MDM   1. Chest pain, unspecified chest pain type    ECG: NSR at 71 bpm with normal intervals and no ectopy. No acute ST/T  wave changes. While issues with patient's left lateral neck appear to be musculoskeletal in origin, chest discomfort and associated dyspnea are somewhat concerning. Clinical evidence does not suggest PE. Patient reports that the development of chest discomfort is what made him seek evaluation. States he wants to make sure he is not having trouble with his heart. Therefore, [facility] and additional cardiac risk stratification.   Jess BartersJennifer Lee Wood[place], [place] 10/31/13 1540

## 2013-11-01 NOTE — ED Provider Notes (Signed)
Medical screening examination/treatment/procedure(s) were performed by non-physician practitioner and as supervising physician I was immediately available for consultation/collaboration.  Darleen Moffitt, M.D.  Jasiel Apachito C Aldan Camey, MD 11/01/13 0744 

## 2013-11-03 NOTE — ED Provider Notes (Signed)
Medical screening examination/treatment/procedure(s) were conducted as a shared visit with non-physician practitioner(s) and myself.  I personally evaluated the patient during the encounter.  Pt c/o left lateral neck pain. Worse w movement and palpation area. Spine nt. Chest cta. Rrr.    Suzi RootsKevin E Antrice Pal, MD 11/03/13 [phone]

## 2015-11-12 IMAGING — CR DG CHEST 2V
2 series · 2 of 2 positions shown · non-contrast
Comparison: Chest x-ray of 07/03/2004

CLINICAL DATA: Left chest pain radiating to the left shoulder, some
shortness of breath

EXAM:
CHEST  2 VIEW

[w chest pa]
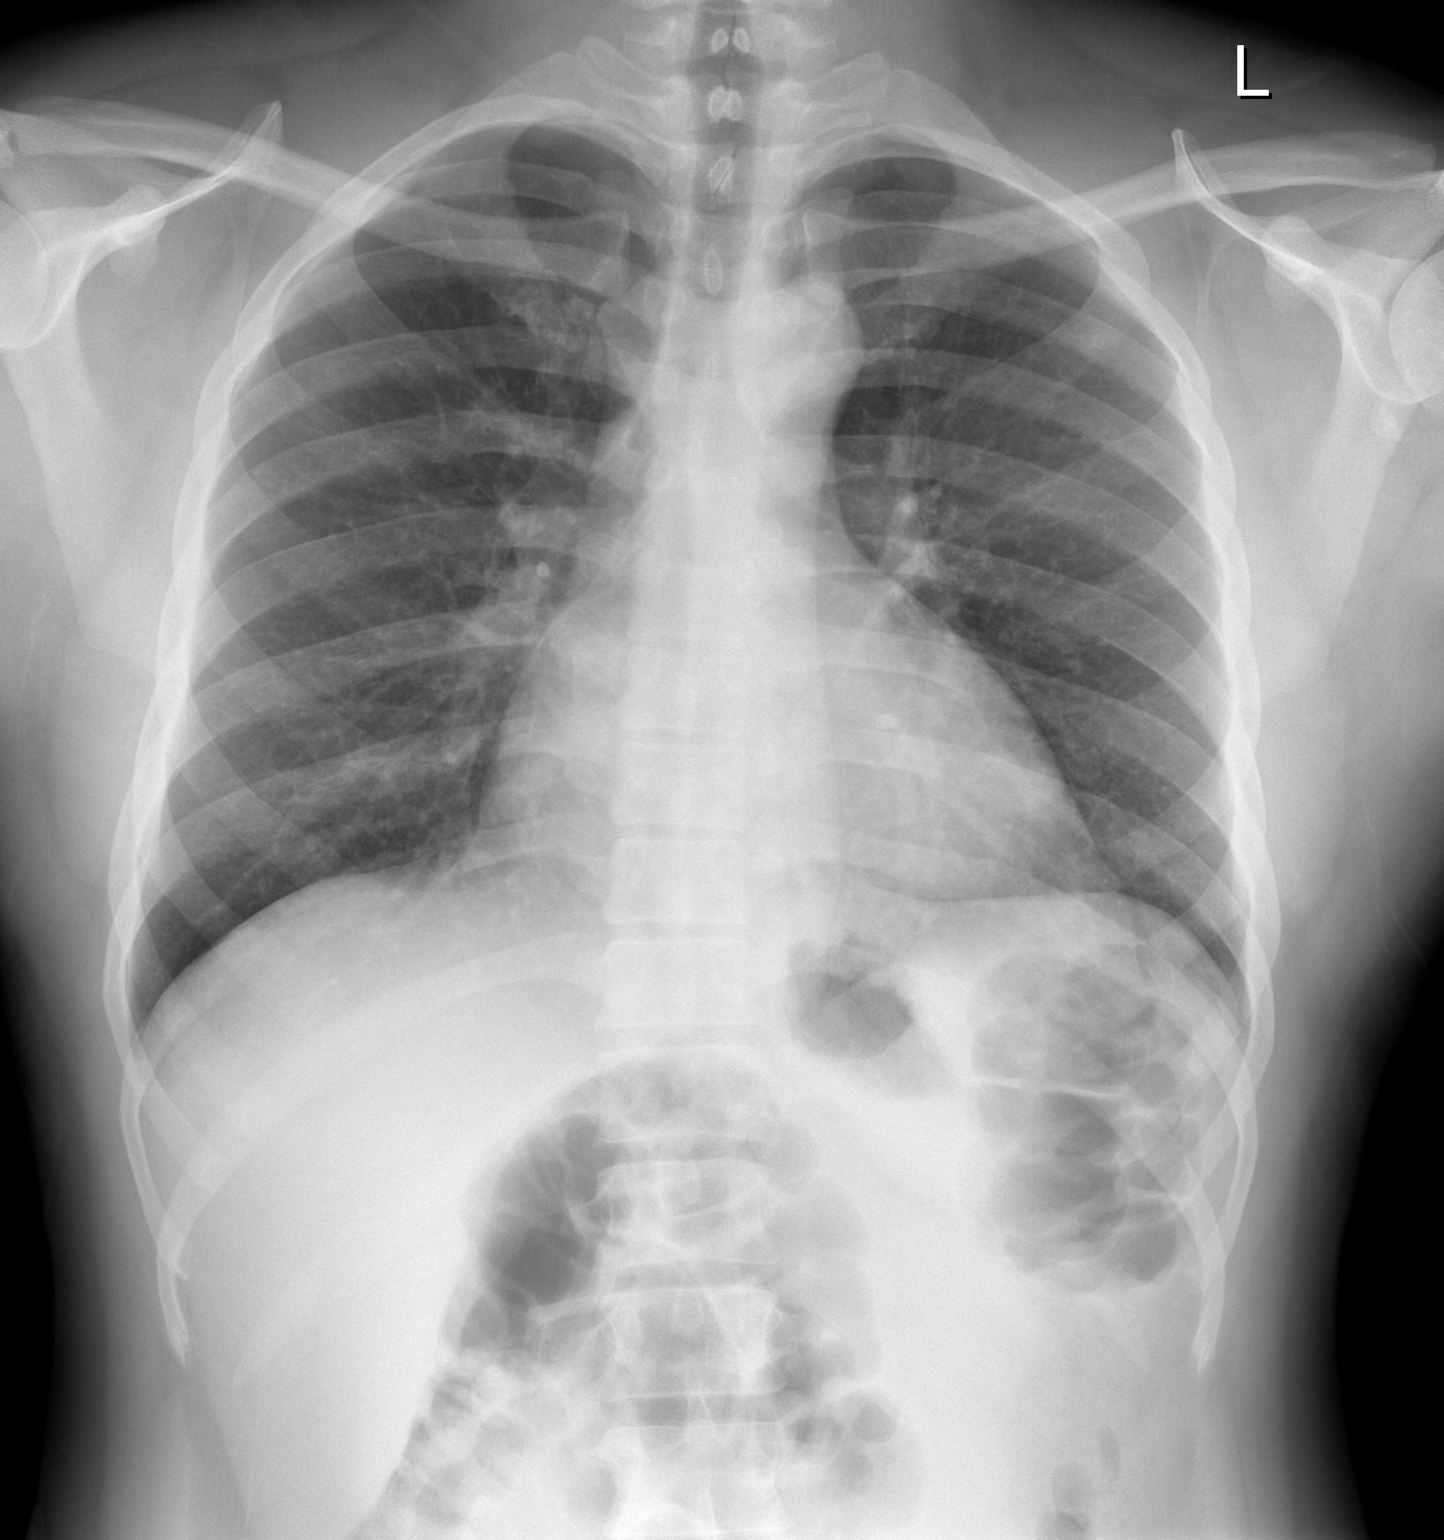

[w chest lat]
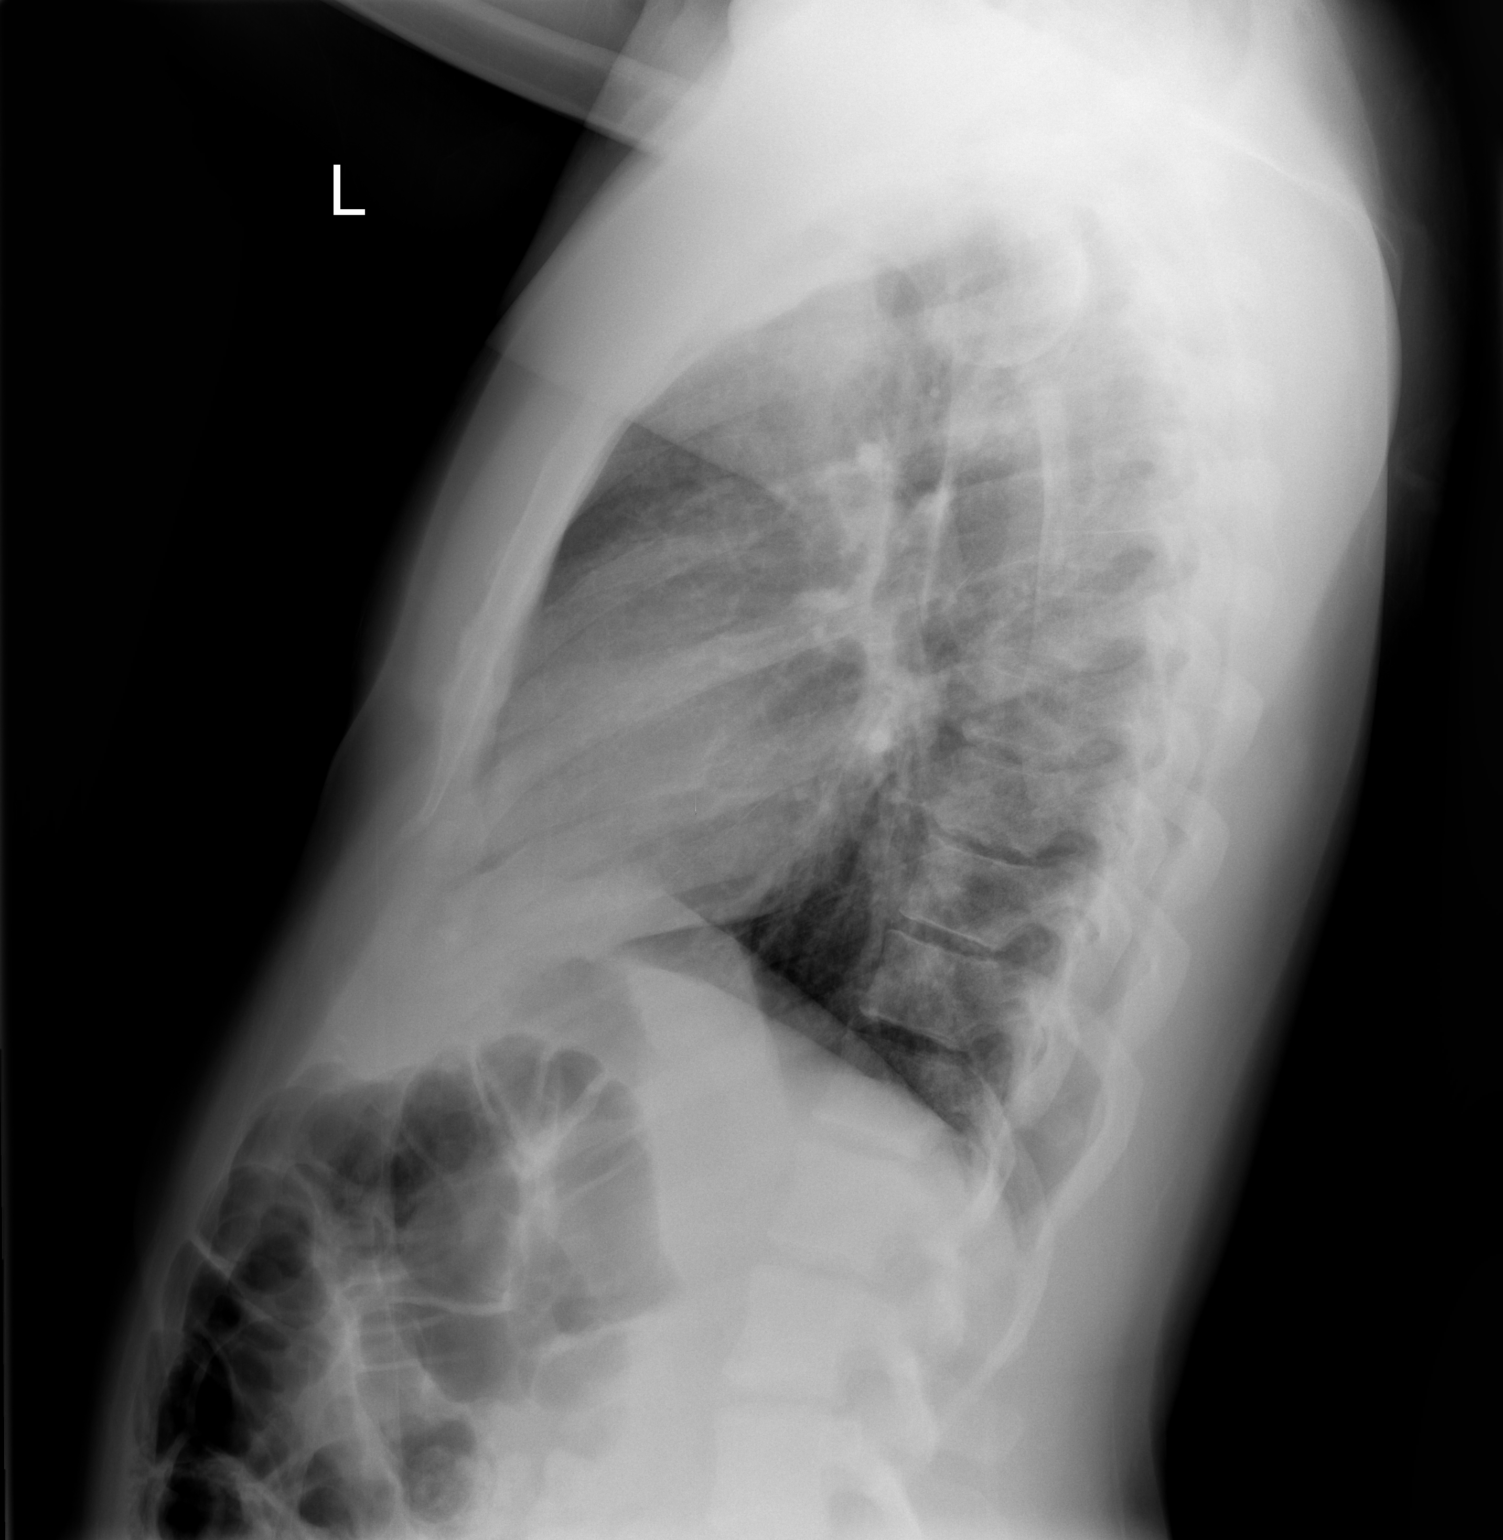

[2 of 2 positions shown; findings below may reference images not displayed]

FINDINGS: No active infiltrate or effusion is seen. There is mild
peribronchial thickening present which may indicate bronchitis.
Mediastinal and hilar contours are unremarkable. The heart is within
normal limits in size. No bony abnormality is seen.
IMPRESSION: No active infiltrate or effusion. Mild peribronchial thickening may
indicate bronchitis.

## 2017-09-07 DIAGNOSIS — Z9109 Other allergy status, other than to drugs and biological substances: Secondary | ICD-10-CM | POA: Insufficient documentation

## 2018-05-25 ENCOUNTER — Ambulatory Visit
Admission: EM | Admit: 2018-05-25 | Discharge: 2018-05-25 | Disposition: A | Discharge status: Home / Self Care | Payer: 59 | Attending: Family Medicine | Admitting: Family Medicine

## 2018-05-25 ENCOUNTER — Encounter: Payer: Self-pay | Admitting: Emergency Medicine

## 2018-05-25 DIAGNOSIS — B349 Viral infection, unspecified: Secondary | ICD-10-CM

## 2018-05-25 DIAGNOSIS — R079 Chest pain, unspecified: Secondary | ICD-10-CM

## 2018-05-25 MED ORDER — CETIRIZINE HCL 10 MG PO CAPS: 10.0000 mg | ORAL_CAPSULE | Freq: Every day | ORAL | 0 refills | Status: DC

## 2018-05-25 MED ORDER — IBUPROFEN 600 MG PO TABS: 600.0000 mg | ORAL_TABLET | Freq: Four times a day (QID) | ORAL | 0 refills | Status: DC | PRN

## 2018-05-25 NOTE — ED Provider Notes (Signed)
[facility]    CSN: [id number] Arrival date & time: 05/25/18  1718     History   Chief Complaint Chief Complaint  Patient presents with  . Flu-Like Symptoms    HPI Dustin Bruce is a 38 y.o. male no contributing past medical history presenting today for evaluation of headache.  Patient states that yesterday morning he woke up with a headache and some mild chest discomfort on his left side.  He took some ibuprofen for this and symptoms are relatively mild the rest of the day.  Today he has had similar symptoms and noted to have a temperature of 99.9.  He is continued to have some slight discomfort in his chest.  He denies any significant cough, states that the cough is mild and very infrequent.  Endorses some mild infrequent sore throat as well as congestion.  He has also had some occasional chills and felt warm.  Denies associated GI upset.  [place] but denies any out of the country travel.  HPI  History reviewed. No pertinent past medical history.  There are no active problems to display for this patient.   Past Surgical History:  Procedure Laterality Date  . ESOPHAGUS SURGERY     age 40;       Home Medications    Prior to Admission medications   Medication Sig Start Date End Date Taking? Authorizing Provider  Cetirizine HCl 10 MG CAPS Take 1 capsule (10 mg total) by mouth daily for 10 days. 05/25/18 06/04/18  Shahram Alexopoulos C, PA-C  ibuprofen (ADVIL,MOTRIN) 600 MG tablet Take 1 tablet (600 mg total) by mouth every 6 (six) hours as needed. 05/25/18   Drevin Ortner, Junius CreamerHallie C, PA-C    Family History Family History  Problem Relation Age of Onset  . Cancer Neg Hx   . Heart disease Neg Hx   . Hypertension Neg Hx   . Diabetes Neg Hx   . Stroke Neg Hx     Social History Social History   Tobacco Use  . Smoking status: Never Smoker  . Smokeless tobacco: Never Used  Substance Use Topics  . Alcohol use: Yes   Comment: social  . Drug use: No     Allergies   Patient has no known allergies.   Review of Systems Review of Systems  Constitutional: Positive for chills, fatigue and fever. Negative for activity change and appetite change.  HENT: Positive for congestion, rhinorrhea and sore throat. Negative for ear pain, sinus pressure and trouble swallowing.   Eyes: Negative for discharge and redness.  Respiratory: Positive for cough. Negative for chest tightness and shortness of breath.   Cardiovascular: Positive for chest pain.  Gastrointestinal: Negative for abdominal pain, diarrhea, nausea and vomiting.  Musculoskeletal: Negative for myalgias.  Skin: Negative for rash.  Neurological: Positive for headaches. Negative for dizziness and light-headedness.     Physical Exam Triage Vital Signs ED Triage Vitals  Enc Vitals Group     BP 05/25/18 1726 136/88     Pulse Rate 05/25/18 1726 (!) 103     Resp 05/25/18 1726 18     Temp 05/25/18 1726 98.2 F (36.8 C)     Temp Source 05/25/18 1726 Oral     SpO2 05/25/18 1726 98 %     Weight --      Height --      Head Circumference --      Peak Flow --  Pain Score 05/25/18 1727 2     Pain Loc --      Pain Edu? --      Excl. in GC? --    No data found.  Updated Vital Signs BP 136/88 (BP Location: Left Arm)   Pulse (!) 103   Temp 98.2 F (36.8 C) (Oral)   Resp 18   SpO2 98%   Visual Acuity Right Eye Distance:   Left Eye Distance:   Bilateral Distance:    Right Eye Near:   Left Eye Near:    Bilateral Near:     Physical Exam Vitals signs and nursing note reviewed.  Constitutional:      Appearance: He is well-developed.  HENT:     Head: Normocephalic and atraumatic.     Ears:     Comments: Bilateral ears without tenderness to palpation of external auricle, tragus and mastoid, EAC's without erythema or swelling, TM's with good bony landmarks and cone of light. Non erythematous.    Nose:     Comments: Nasal mucosa slightly  erythematous    Mouth/Throat:     Comments: Oral mucosa pink and moist, moderate tonsillar enlargement, mild erythema. Posterior pharynx patent and nonerythematous, no uvula deviation or swelling. Normal phonation. Eyes:     Conjunctiva/sclera: Conjunctivae normal.  Neck:     Musculoskeletal: Neck supple.  Cardiovascular:     Rate and Rhythm: Normal rate and regular rhythm.     Heart sounds: No murmur.  Pulmonary:     Effort: Pulmonary effort is normal. No respiratory distress.     Breath sounds: Normal breath sounds.     Comments: Breathing comfortably at rest, CTABL, no wheezing, rales or other adventitious sounds auscultated  Chest discomfort not reproducible on exam Abdominal:     Palpations: Abdomen is soft.     Tenderness: There is no abdominal tenderness.  Skin:    General: Skin is warm and dry.  Neurological:     Mental Status: He is alert.      UC Treatments / Results  Labs (all labs ordered are listed, but only abnormal results are displayed) Labs Reviewed - No data to display  EKG None  Radiology No results found.  Procedures Procedures (including critical care time)  Medications Ordered in UC Medications - No data to display  Initial Impression / Assessment and Plan / UC Course  I have reviewed the triage vital signs and the nursing notes.  Pertinent labs & imaging results that were available during my care of the patient were reviewed by me and considered in my medical decision making (see chart for details).     EKG normal sinus rhythm, does have T wave inversions in isolated lead III, present on previous EKG in 2015 as well.  Patient does have some mild changes of T waves in leads V4 through V6, nonspecific, minimally inverted.  At this time will have patient continue to monitor chest discomfort if worsening or developing new symptoms with this to go to the emergency room.  Do not suspect underlying cardiac event at this time.  Headache and mild  fever, likely viral etiology.  Some mild associated URI symptoms.  Recommending symptomatic and supportive care.  Discussed strict return precautions. Patient verbalized understanding and is agreeable with plan.  Final Clinical Impressions(s) / UC Diagnoses   Final diagnoses:  Viral illness     Discharge Instructions     Symptoms are most likely viral causing low-grade fever and headache Use anti-inflammatories for pain/swelling.  You may take up to 800 mg Ibuprofen every 8 hours with food. You may supplement Ibuprofen with Tylenol [phone] mg every 8 hours.  May consider starting daily cetirizine/Zyrtec or Claritin may get over-the-counter for cheaper to help with congestion and any drainage If you develop more of a cough may try Delsym or Robitussin-DM over-the-counter Drink plenty of fluids Rest Follow-up if symptoms changing or worsening    ED Prescriptions    Medication Sig Dispense Auth. Provider   ibuprofen (ADVIL,MOTRIN) 600 MG tablet Take 1 tablet (600 mg total) by mouth every 6 (six) hours as needed. 30 tablet Reyaansh Merlo C, PA-C   Cetirizine HCl 10 MG CAPS Take 1 capsule (10 mg total) by mouth daily for 10 days. 10 capsule Nailea Whitehorn C, PA-C     Controlled Substance Prescriptions [place] Controlled Substance Registry consulted? Not Applicable   Lew Dawes, [place] 05/25/18 1757

## 2018-05-25 NOTE — Discharge Instructions (Addendum)
Symptoms are most likely viral causing low-grade fever and headache Use anti-inflammatories for pain/swelling. You may take up to 800 mg Ibuprofen every 8 hours with food. You may supplement Ibuprofen with Tylenol [phone] mg every 8 hours.  May consider starting daily cetirizine/Zyrtec or Claritin may get over-the-counter for cheaper to help with congestion and any drainage If you develop more of a cough may try Delsym or Robitussin-DM over-the-counter Drink plenty of fluids Rest Follow-up if symptoms changing or worsening  Please follow-up in the emergency room if having worsening chest pain

## 2018-05-25 NOTE — ED Notes (Signed)
Patient able to ambulate independently  

## 2018-05-25 NOTE — ED Triage Notes (Signed)
[facility] headache starting Monday, feeling hot and chills at work, and today noted a temp of 99.9

## 2018-07-18 ENCOUNTER — Ambulatory Visit
Admission: EM | Admit: 2018-07-18 | Discharge: 2018-07-18 | Disposition: A | Discharge status: Home / Self Care | Payer: 59 | Attending: Physician Assistant | Admitting: Physician Assistant

## 2018-07-18 DIAGNOSIS — R0789 Other chest pain: Secondary | ICD-10-CM | POA: Diagnosis not present

## 2018-07-18 MED ORDER — OMEPRAZOLE 20 MG PO CPDR: 20.0000 mg | DELAYED_RELEASE_CAPSULE | Freq: Every day | ORAL | 0 refills | Status: DC

## 2018-07-18 NOTE — ED Provider Notes (Signed)
[facility]    CSN: [id number] Arrival date & time: 07/18/18  1715     History   Chief Complaint Chief Complaint  Patient presents with  . Chest Pain    HPI Dustin Bruce is a 38 y.o. male.   38 year old male comes in for 1 week history of intermittent left-sided chest pain.  States it feels like a tightness/"gas bubble" that is intermittent, worse with deep breathing.  Sometimes he breathes shallowly to make it feel better.  At first started when he was on the plane to [facility].  He denies any nausea, vomiting, diaphoresis, shortness of breath.  He denies palpitation, weakness, dizziness, syncope.  He denies URI symptoms such as cough, congestion, sore throat.  Denies fever, chills, night sweats.  Denies abdominal pain.  He normally has about 4-5 stools a day ranging from [facility] stool chart of type IV to type V, and has been normal for him.  States given travel, has been eating a lot of fast foods, and drinking more sodas.  He denies exertional fatigue, exertional chest pain, dyspnea on exertion.  He leg swelling, orthopnea.  Denies history of blood clots/DVT.  Denies personal history of hypertension, diabetes, heart disease.  States maternal grandfather passed away from MI in his late 58s.  He denies drug use.  Never smoker.  Occasional alcohol use.  Patient with history of esophagus surgery at age 71.  He does not recall what the procedure was for, states has not had any problems with esophagus since he was a child, and has not needed to follow-up with GI.     History reviewed. No pertinent past medical history.  There are no active problems to display for this patient.   Past Surgical History:  Procedure Laterality Date  . ESOPHAGUS SURGERY     age 18;       Home Medications    Prior to Admission medications   Medication Sig Start Date End Date Taking? Authorizing Provider  Cetirizine HCl 10 MG CAPS Take 1 capsule (10 mg total) by mouth daily for 10  days. 05/25/18 06/04/18  Wieters, Hallie C, PA-C  ibuprofen (ADVIL,MOTRIN) 600 MG tablet Take 1 tablet (600 mg total) by mouth every 6 (six) hours as needed. 05/25/18   Wieters, Hallie C, PA-C  omeprazole (PRILOSEC) 20 MG capsule Take 1 capsule (20 mg total) by mouth daily. 07/18/18   Belinda Fisher, PA-C    Family History Family History  Problem Relation Age of Onset  . Cancer Neg Hx   . Heart disease Neg Hx   . Hypertension Neg Hx   . Diabetes Neg Hx   . Stroke Neg Hx     Social History Social History   Tobacco Use  . Smoking status: Never Smoker  . Smokeless tobacco: Never Used  Substance Use Topics  . Alcohol use: Yes    Comment: social  . Drug use: No     Allergies   Patient has no known allergies.   Review of Systems Review of Systems  Reason unable to perform ROS: See HPI as above.     Physical Exam Triage Vital Signs ED Triage Vitals [07/18/18 1726]  Enc Vitals Group     BP (!) 136/98     Pulse Rate 84     Resp 18     Temp 98.4 F (36.9 C)     Temp Source Oral     SpO2 97 %     Weight  Height      Head Circumference      Peak Flow      Pain Score 4     Pain Loc      Pain Edu?      Excl. in GC?    No data found.  Updated Vital Signs BP (!) 136/98 (BP Location: Left Arm)   Pulse 84   Temp 98.4 F (36.9 C) (Oral)   Resp 18   SpO2 97%   Physical Exam Constitutional:      General: He is not in acute distress.    Appearance: He is well-developed. He is not ill-appearing, toxic-appearing or diaphoretic.  HENT:     Head: Normocephalic and atraumatic.     Right Ear: Tympanic membrane, ear canal and external ear normal. Tympanic membrane is not erythematous or bulging.     Left Ear: Tympanic membrane, ear canal and external ear normal. Tympanic membrane is not erythematous or bulging.     Nose: Nose normal.     Right Sinus: No maxillary sinus tenderness or frontal sinus tenderness.     Left Sinus: No maxillary sinus tenderness or frontal sinus  tenderness.     Mouth/Throat:     Mouth: Mucous membranes are moist.     Pharynx: Oropharynx is clear. Uvula midline.  Eyes:     Conjunctiva/sclera: Conjunctivae normal.     Pupils: Pupils are equal, round, and reactive to light.  Neck:     Musculoskeletal: Normal range of motion and neck supple.  Cardiovascular:     Rate and Rhythm: Normal rate and regular rhythm.     Heart sounds: Normal heart sounds. No murmur. No friction rub. No gallop.   Pulmonary:     Effort: Pulmonary effort is normal. No tachypnea, accessory muscle usage, prolonged expiration, respiratory distress or retractions.     Breath sounds: Normal breath sounds. No stridor, decreased air movement or transmitted upper airway sounds. No decreased breath sounds, wheezing, rhonchi or rales.  Chest:     Chest wall: No tenderness.  Abdominal:     General: A surgical scar is present. Bowel sounds are normal.     Palpations: Abdomen is soft.     Tenderness: There is no abdominal tenderness. There is no right CVA tenderness, left CVA tenderness, guarding or rebound.     Hernia: No hernia is present.  Skin:    General: Skin is warm and dry.  Neurological:     Mental Status: He is alert and oriented to person, place, and time.      UC Treatments / Results  Labs (all labs ordered are listed, but only abnormal results are displayed) Labs Reviewed - No data to display  EKG None  Radiology No results found.  Procedures Procedures (including critical care time)  Medications Ordered in UC Medications - No data to display  Initial Impression / Assessment and Plan / UC Course  I have reviewed the triage vital signs and the nursing notes.  Pertinent labs & imaging results that were available during my care of the patient were reviewed by me and considered in my medical decision making (see chart for details).    No alarming signs on exam.  EKG normal sinus rhythm, 83 bpm, T wave inversion to lead III, unchanged from  prior.  Atypical chest pain that does not seem cardiac related given current history and exam.  Patient without leg swelling, tachycardia, tachypnea, hypoxia, lower suspicion for PE at this time.  Given recent changes  in diet prior to symptom onset, discussed possible GERD contributing to symptoms.  Will start omeprazole as directed.  Discussed foods to avoid at this time.  Return precautions given.  Otherwise follow-up with PCP for further evaluation if symptoms not improving.  Final Clinical Impressions(s) / UC Diagnoses   Final diagnoses:  Atypical chest pain    ED Prescriptions    Medication Sig Dispense Auth. Provider   omeprazole (PRILOSEC) 20 MG capsule Take 1 capsule (20 mg total) by mouth daily. 15 capsule Threasa Alpha, [place] 07/18/18 1819

## 2018-07-18 NOTE — ED Triage Notes (Signed)
Pt c/o lt sided chest pain for a week off and on. Denies pain radiating, SOB and diaphoresis. Pt states feels like a gas pocket in the same spot

## 2018-07-18 NOTE — Discharge Instructions (Addendum)
No alarming signs on exam. Your EKG was unchanged from prior. As discussed, we will try acid reflux medicine to see if it is contributing to the symptoms. Start omeprazole as directed for the next 1-2 weeks. Follow diet change discussed, I have attached some information. Keep hydrated, urine should be clear to pale yellow in color. If experiencing worsening symptoms, sudden worsening chest pain, shortness of breath, weakness, dizziness, one sided leg swelling, sweating, nausea/vomiting, go to the emergency department for further evaluation needed. Otherwise, follow up with PCP for further evaluation and management needed.

## 2019-08-06 ENCOUNTER — Telehealth (INDEPENDENT_AMBULATORY_CARE_PROVIDER_SITE_OTHER): Payer: 59 | Admitting: Internal Medicine

## 2019-08-06 ENCOUNTER — Encounter: Payer: Self-pay | Admitting: Internal Medicine

## 2019-08-06 DIAGNOSIS — Z7689 Persons encountering health services in other specified circumstances: Secondary | ICD-10-CM

## 2019-08-06 NOTE — Progress Notes (Signed)
Virtual Visit via Telephone Note  I connected with Dustin Bruce, on 08/06/2019 at 8:50 AM by telephone due to the COVID-19 pandemic and verified that I am speaking with the correct person using two identifiers.   Consent: I discussed the limitations, risks, security and privacy concerns of performing an evaluation and management service by telephone and the availability of in person appointments. I also discussed with the patient that there may be a patient responsible charge related to this service. The patient expressed understanding and agreed to proceed.   Location of Patient: Home   Location of Provider: Home    Persons participating in Telemedicine visit: Dustin Bruce [facility] Dr. Earlene Plater      History of Present Illness: Patient has a visit to establish care.   Patient had an urgent care visit on 3/22 for atypical chest pain. Patient had benign exam and normal EKG. Thought to be related to GERD and started on Omeprazole. Patient reports he has felt fine since that visit. He actually didn't remember what visit was for. He has not taken PPI since initial Rx was given.   Reports no significant PMH. Had some type of surgery on his esophagus when he was around 39 years old. Has a surgical scar on his stomach.   Is currently using a topical steroid for alopecia.    No past medical history on file. No Known Allergies  No current outpatient medications on file prior to visit.   No current facility-administered medications on file prior to visit.    Observations/Objective: NAD. Speaking clearly.  Work of breathing normal.  Alert and oriented. Mood appropriate.   Assessment and Plan: 1. Encounter to establish care Reviewed PMH and medications with patient. No concerns today. Return for annual exam with screening labs.    Follow Up Instructions: Annual exam    I discussed the assessment and treatment plan with the patient. The patient was provided an  opportunity to ask questions and all were answered. The patient agreed with the plan and demonstrated an understanding of the instructions.   The patient was advised to call back or seek an in-person evaluation if the symptoms worsen or if the condition fails to improve as anticipated.     I provided 14 minutes total of non-face-to-face time during this encounter including median intraservice time, reviewing previous notes, investigations, ordering medications, medical decision making, coordinating care and patient verbalized understanding at the end of the visit.    Marcy Siren, D.O. Primary Care at [facility]  08/06/2019, 8:50 AM

## 2019-08-16 ENCOUNTER — Telehealth: Payer: Self-pay

## 2019-08-16 NOTE — Patient Instructions (Addendum)
Reset your Mychart password: temporary password [phone] You will be notifed of your lab results via Mychart   Keeping you healthy  Get these tests  Blood pressure- Have your blood pressure checked once a year by your healthcare provider.  Normal blood pressure is 120/80.  Weight- Have your body mass index (BMI) calculated to screen for obesity.  BMI is a measure of body fat based on height and weight. You can also calculate your own BMI at https://www.west-esparza.com/.  Cholesterol- Have your cholesterol checked regularly starting at age 9, sooner may be necessary if you have diabetes, high blood pressure, if a family member developed heart diseases at an early age or if you smoke.   Chlamydia, HIV, and other sexual transmitted disease- Get screened each year until the age of 27 then within three months of each new sexual partner.  Diabetes- Have your blood sugar checked regularly if you have high blood pressure, high cholesterol, a family history of diabetes or if you are overweight.  Get these vaccines  Flu shot- Every fall.  Tetanus shot- Every 10 years.  Menactra- Single dose; prevents meningitis.  Take these steps  Don't smoke- If you do smoke, ask your healthcare provider about quitting. For tips on how to quit, go to www.smokefree.gov or call 1-800-QUIT-NOW.  Be physically active- Exercise 5 days a week for at least 30 minutes.  If you are not already physically active start slow and gradually work up to 30 minutes of moderate physical activity.  Examples of moderate activity include walking briskly, mowing the yard, dancing, swimming bicycling, etc.  Eat a healthy diet- Eat a variety of healthy foods such as fruits, vegetables, low fat milk, low fat cheese, yogurt, lean meats, poultry, fish, beans, tofu, etc.  For more information on healthy eating, go to www.thenutritionsource.org  Drink alcohol in moderation- Limit alcohol intake two drinks or less a day.  Never drink and  drive.  Dentist- Brush and floss teeth twice daily; visit your dentis twice a year.  Depression-Your emotional health is as important as your physical health.  If you're feeling down, losing interest in things you normally enjoy please talk with your healthcare provider.  Gun Safety- If you keep a gun in your home, keep it unloaded and with the safety lock on.  Bullets should be stored separately.  Helmet use- Always wear a helmet when riding a motorcycle, bicycle, rollerblading or skateboarding.  Safe sex- If you may be exposed to a sexually transmitted infection, use a condom  Seat belts- Seat bels can save your life; always wear one.  Smoke/Carbon Monoxide detectors- These detectors need to be installed on the appropriate level of your home.  Replace batteries at least once a year.  Skin Cancer- When out in the sun, cover up and use sunscreen SPF 15 or higher.  Violence- If anyone is threatening or hurting you, please tell your healthcare provider.

## 2019-08-16 NOTE — Telephone Encounter (Signed)

## 2019-08-17 ENCOUNTER — Ambulatory Visit (INDEPENDENT_AMBULATORY_CARE_PROVIDER_SITE_OTHER): Payer: 59 | Admitting: Family Medicine

## 2019-08-17 ENCOUNTER — Other Ambulatory Visit: Payer: Self-pay

## 2019-08-17 VITALS — BP 133/76 | HR 58 | Temp 97.3°F | Resp 17 | Ht 67.0 in | Wt 177.0 lb

## 2019-08-17 DIAGNOSIS — Z1389 Encounter for screening for other disorder: Secondary | ICD-10-CM

## 2019-08-17 DIAGNOSIS — Z0000 Encounter for general adult medical examination without abnormal findings: Secondary | ICD-10-CM | POA: Diagnosis not present

## 2019-08-17 DIAGNOSIS — Z131 Encounter for screening for diabetes mellitus: Secondary | ICD-10-CM | POA: Diagnosis not present

## 2019-08-17 DIAGNOSIS — Z1329 Encounter for screening for other suspected endocrine disorder: Secondary | ICD-10-CM

## 2019-08-17 DIAGNOSIS — Z1321 Encounter for screening for nutritional disorder: Secondary | ICD-10-CM

## 2019-08-17 DIAGNOSIS — Z114 Encounter for screening for human immunodeficiency virus [HIV]: Secondary | ICD-10-CM | POA: Diagnosis not present

## 2019-08-17 DIAGNOSIS — Z125 Encounter for screening for malignant neoplasm of prostate: Secondary | ICD-10-CM | POA: Diagnosis not present

## 2019-08-17 LAB — POCT URINALYSIS DIP (CLINITEK)
Bilirubin, UA: NEGATIVE
Blood, UA: NEGATIVE
Glucose, UA: NEGATIVE mg/dL
Ketones, POC UA: NEGATIVE mg/dL
Leukocytes, UA: NEGATIVE
Nitrite, UA: NEGATIVE
POC PROTEIN,UA: NEGATIVE
Spec Grav, UA: 1.02 (ref 1.010–1.025)
Urobilinogen, UA: 0.2 E.U./dL
pH, UA: 6.5 (ref 5.0–8.0)

## 2019-08-17 NOTE — Progress Notes (Signed)
Established Patient Office Visit  Subjective:  Patient ID: Dustin Bruce, male    DOB: November 16, 1980  Age: 39 y.o. MRN: [id number]  CC:  Chief Complaint  Patient presents with  . Annual Exam    patient is fasting    HPI Dustin Bruce presents for routine annual physical. Last physical exam over 1 year ago. Endorses occasional exercise including jogging. He is a non-smoker although endorses exposure to second hand smoke. Receives routine dental care. Recently had 3 teeth extracted and deep clean. Wearing invisalign for teeth straightening. No recent eye exam. Denies changes in visual acuity. Recently seen at urgent care for chest pain which was determined to be related to GERD. He has cut back on caffeine and notifed the symptoms have resolved without medication intervention.  Past Medical History:  Diagnosis Date  . Alopecia     Past Surgical History:  Procedure Laterality Date  . ESOPHAGUS SURGERY     age 57;    Family History  Problem Relation Age of Onset  . Cancer Neg Hx   . Heart disease (died of Heat Attack)  Grandfather  60  . Hypertension Neg Hx   . Diabetes Father   . Stroke Neg Hx     Social History   Socioeconomic History  . Marital status: Married    Spouse name: Not on file  . Number of children: Not on file  . Years of education: Not on file  . Highest education level: Not on file  Occupational History  . Not on file  Tobacco Use  . Smoking status: Never Smoker  . Smokeless tobacco: Never Used  Substance and Sexual Activity  . Alcohol use: Yes    Comment: social  . Drug use: No  . Sexual activity: Not on file  Other Topics Concern  . Not on file  Social History Narrative   Married, has 2 children, ages 81yo and 13yo, works in [employer], exercise - infrequently   Social Determinants of Health   Financial Resource Strain:   . Difficulty of Paying Living Expenses:   Food Insecurity:   . Worried About Programme researcher, broadcasting/film/video  in the Last Year:   . Barista in the Last Year:   Transportation Needs:   . Freight forwarder (Medical):   Marland Kitchen Lack of Transportation (Non-Medical):   Physical Activity:   . Days of Exercise per Week:   . Minutes of Exercise per Session:   Stress:   . Feeling of Stress :   Social Connections:   . Frequency of Communication with Friends and Family:   . Frequency of Social Gatherings with Friends and Family:   . Attends Religious Services:   . Active Member of Clubs or Organizations:   . Attends Banker Meetings:   Marland Kitchen Marital Status:   Intimate Partner Violence:   . Fear of Current or Ex-Partner:   . Emotionally Abused:   Marland Kitchen Physically Abused:   . Sexually Abused:     Outpatient Medications Prior to Visit  Medication Sig Dispense Refill  . fluocinolone (SYNALAR) 0.01 % external solution 1 APPLICATION TWICE A DAY MON FRIDAY ONLY EXTERNALLY    . Multiple Vitamin (MULTIVITAMIN) tablet Take 1 tablet by mouth daily.     No facility-administered medications prior to visit.    No Known Allergies  ROS Review of Systems Pertinent negatives listed in HPI   Objective:    Physical Exam  BP  133/76   Pulse (!) 58   Temp (!) 97.3 F (36.3 C) (Temporal)   Resp 17   Ht 5\' 7"  (1.702 m)   Wt 177 lb (80.3 kg)   SpO2 97%   BMI 27.72 kg/m  Wt Readings from Last 3 Encounters:  08/17/19 177 lb (80.3 kg)  04/12/12 174 lb (78.9 kg)  03/10/12 174 lb (78.9 kg)   Physical Exam: Constitutional: Patient appears well-developed and well-nourished. No distress. HENT: Normocephalic, atraumatic, External right and left ear normal.  Eyes: Conjunctivae and EOM are normal. PERRLA, no scleral icterus. Neck: Normal ROM. Neck supple. No JVD. No tracheal deviation. No thyromegaly. CVS: RRR, S1/S2 +, no murmurs, no gallops, no carotid bruit.  Pulmonary: Effort and breath sounds normal, no stridor, rhonchi, wheezes, rales.  Abdominal: Soft. BS +, no distension, tenderness,  rebound or guarding.  Musculoskeletal: Normal range of motion. No edema and no tenderness.  Neuro: Alert. Normal reflexes, muscle tone coordination. Skin: Skin is warm and dry. No rash noted. Not diaphoretic. No erythema. No pallor. Psychiatric: Normal mood and affect. Behavior, judgment, thought content normal.  Health Maintenance Due  Topic Date Due  . HIV Screening  Never done    There are no preventive care reminders to display for this patient.  Lab Results  Component Value Date   TSH 2.525 03/10/2012   Lab Results  Component Value Date   WBC 6.3 10/31/2013   HGB 15.8 10/31/2013   HCT 45.5 10/31/2013   MCV 86.5 10/31/2013   PLT 175 10/31/2013   Lab Results  Component Value Date   NA 142 10/31/2013   K 3.8 10/31/2013   CO2 22 10/31/2013   GLUCOSE 88 10/31/2013   BUN 12 10/31/2013   CREATININE 0.97 10/31/2013   BILITOT 1.4 (H) 03/10/2012   ALKPHOS 44 03/10/2012   AST 16 03/10/2012   ALT 16 03/10/2012   PROT 7.1 03/10/2012   ALBUMIN 4.9 03/10/2012   CALCIUM 9.5 10/31/2013   ANIONGAP 16 (H) 10/31/2013   Lab Results  Component Value Date   CHOL 160 03/10/2012   Lab Results  Component Value Date   HDL 37 (L) 03/10/2012   Lab Results  Component Value Date   LDLCALC 98 03/10/2012   Lab Results  Component Value Date   TRIG 124 03/10/2012   Lab Results  Component Value Date   CHOLHDL 4.3 03/10/2012   No results found for: HGBA1C    Assessment & Plan:   Problem List Items Addressed This Visit    None    Visit Diagnoses    Annual physical exam    -  Primary   Relevant Orders   CBC with Differential   Comprehensive metabolic panel   Lipid Panel   POCT URINALYSIS DIP (CLINITEK)   Screening PSA (prostate specific antigen)       Relevant Orders   PSA   Screening for HIV (human immunodeficiency virus)       Relevant Orders   HIV antibody (with reflex)   Diabetes mellitus screening       Relevant Orders   Hemoglobin A1c   Thyroid disorder screen        Relevant Orders   Thyroid Panel With TSH   Screening for blood or protein in urine       Relevant Orders   POCT URINALYSIS DIP (CLINITEK)     Age-appropriate anticipatory guidance provided.  No orders of the defined types were placed in this encounter.   Follow-up: Return  in about 1 year (around 08/16/2020).    Molli Barrows, FNP

## 2019-08-18 LAB — COMPREHENSIVE METABOLIC PANEL
ALT: 20 IU/L (ref 0–44)
AST: 20 IU/L (ref 0–40)
Albumin/Globulin Ratio: 1.8 (ref 1.2–2.2)
Albumin: 4.7 g/dL (ref 4.0–5.0)
Alkaline Phosphatase: 60 IU/L (ref 39–117)
BUN/Creatinine Ratio: 11 (ref 9–20)
BUN: 12 mg/dL (ref 6–24)
Bilirubin Total: 1 mg/dL (ref 0.0–1.2)
CO2: 20 mmol/L (ref 20–29)
Calcium: 9.2 mg/dL (ref 8.7–10.2)
Chloride: 100 mmol/L (ref 96–106)
Creatinine, Ser: 1.07 mg/dL (ref 0.76–1.27)
GFR calc Af Amer: 100 mL/min/{1.73_m2} (ref >59)
GFR calc non Af Amer: 86 mL/min/{1.73_m2} (ref >59)
Globulin, Total: 2.6 g/dL (ref 1.5–4.5)
Glucose: 90 mg/dL (ref 65–99)
Potassium: 4 mmol/L (ref 3.5–5.2)
Sodium: 135 mmol/L (ref 134–144)
Total Protein: 7.3 g/dL (ref 6.0–8.5)

## 2019-08-18 LAB — VITAMIN D 25 HYDROXY (VIT D DEFICIENCY, FRACTURES): Vit D, 25-Hydroxy: 31.5 ng/mL (ref 30.0–100.0)

## 2019-08-18 LAB — LIPID PANEL
Chol/HDL Ratio: 3.7 ratio (ref 0.0–5.0)
Cholesterol, Total: 161 mg/dL (ref 100–199)
HDL: 44 mg/dL (ref >39)
LDL Chol Calc (NIH): 95 mg/dL (ref 0–99)
Triglycerides: 121 mg/dL (ref 0–149)
VLDL Cholesterol Cal: 22 mg/dL (ref 5–40)

## 2019-08-18 LAB — PSA: Prostate Specific Ag, Serum: 1.1 ng/mL (ref 0.0–4.0)

## 2019-08-18 LAB — CBC WITH DIFFERENTIAL/PLATELET
Basophils Absolute: 0 10*3/uL (ref 0.0–0.2)
Basos: 1 %
EOS (ABSOLUTE): 0.4 10*3/uL (ref 0.0–0.4)
Eos: 6 %
Hematocrit: 44.5 % (ref 37.5–51.0)
Hemoglobin: 15.3 g/dL (ref 13.0–17.7)
Immature Grans (Abs): 0 10*3/uL (ref 0.0–0.1)
Immature Granulocytes: 0 %
Lymphocytes Absolute: 2 10*3/uL (ref 0.7–3.1)
Lymphs: 36 %
MCH: 30.2 pg (ref 26.6–33.0)
MCHC: 34.4 g/dL (ref 31.5–35.7)
MCV: 88 fL (ref 79–97)
Monocytes Absolute: 0.5 10*3/uL (ref 0.1–0.9)
Monocytes: 9 %
Neutrophils Absolute: 2.6 10*3/uL (ref 1.4–7.0)
Neutrophils: 48 %
Platelets: 200 10*3/uL (ref 150–450)
RBC: 5.06 x10E6/uL (ref 4.14–5.80)
RDW: 12.7 % (ref 11.6–15.4)
WBC: 5.5 10*3/uL (ref 3.4–10.8)

## 2019-08-18 LAB — HEMOGLOBIN A1C
Est. average glucose Bld gHb Est-mCnc: 120 mg/dL
Hgb A1c MFr Bld: 5.8 % — ABNORMAL HIGH (ref 4.8–5.6)

## 2019-08-18 LAB — THYROID PANEL WITH TSH
Free Thyroxine Index: 2.1 (ref 1.2–4.9)
T3 Uptake Ratio: 29 % (ref 24–39)
T4, Total: 7.3 ug/dL (ref 4.5–12.0)
TSH: 2.13 u[IU]/mL (ref 0.450–4.500)

## 2019-08-18 LAB — HIV ANTIBODY (ROUTINE TESTING W REFLEX): HIV Screen 4th Generation wRfx: NONREACTIVE

## 2019-12-11 ENCOUNTER — Other Ambulatory Visit: Payer: Self-pay

## 2019-12-11 ENCOUNTER — Ambulatory Visit (HOSPITAL_COMMUNITY)
Admission: EM | Admit: 2019-12-11 | Discharge: 2019-12-11 | Disposition: A | Discharge status: Home / Self Care | Payer: 59 | Attending: Urgent Care | Admitting: Urgent Care

## 2019-12-11 ENCOUNTER — Encounter (HOSPITAL_COMMUNITY): Payer: Self-pay | Admitting: Emergency Medicine

## 2019-12-11 DIAGNOSIS — K921 Melena: Secondary | ICD-10-CM

## 2019-12-11 DIAGNOSIS — K644 Residual hemorrhoidal skin tags: Secondary | ICD-10-CM

## 2019-12-11 DIAGNOSIS — K6289 Other specified diseases of anus and rectum: Secondary | ICD-10-CM

## 2019-12-11 MED ORDER — HYDROCORTISONE (PERIANAL) 2.5 % EX CREA
1.0000 | TOPICAL_CREAM | Freq: Two times a day (BID) | CUTANEOUS | 0 refills | Status: DC
Start: 2019-12-11 — End: 2021-11-08

## 2019-12-11 MED ORDER — DOCUSATE SODIUM 100 MG PO CAPS
100.0000 mg | ORAL_CAPSULE | Freq: Two times a day (BID) | ORAL | 0 refills | Status: DC
Start: 2019-12-11 — End: 2021-11-08

## 2019-12-11 NOTE — ED Triage Notes (Signed)
Pt presents with blood in stool xs 2 days. States sees blood in the toilet when having a bowel movement. Denies abdominal pain, nausea, vomiting.

## 2019-12-11 NOTE — ED Provider Notes (Signed)
[facility]   MRN: [id number] DOB: Oct 02, 1980  Subjective:   Dustin Bruce is a 39 y.o. male presenting for 2 day hx of acute onset bloody stools, bright red blood on toilet paper. Patient denies fever, nausea, vomiting, belly pain, dysuria, hematuria, problems with constipation. Denies history of GI disorder, family history of cancer.  No current facility-administered medications for this encounter.  Current Outpatient Medications:    fluocinolone (SYNALAR) 0.01 % external solution, 1 APPLICATION TWICE A DAY MON FRIDAY ONLY EXTERNALLY, Disp: , Rfl:    Multiple Vitamin (MULTIVITAMIN) tablet, Take 1 tablet by mouth daily., Disp: , Rfl:    No Known Allergies  Past Medical History:  Diagnosis Date   Alopecia      Past Surgical History:  Procedure Laterality Date   ESOPHAGUS SURGERY     age 46;    Family History  Problem Relation Age of Onset   Cancer Neg Hx    Heart disease Neg Hx    Hypertension Neg Hx    Diabetes Neg Hx    Stroke Neg Hx     Social History   Tobacco Use   Smoking status: Never Smoker   Smokeless tobacco: Never Used  Substance Use Topics   Alcohol use: Yes    Comment: social   Drug use: No    ROS   Objective:   Vitals: BP 119/75 (BP Location: Right Arm)    Pulse 95    Temp 98.7 F (37.1 C) (Oral)    Resp 18    SpO2 100%   Physical Exam Constitutional:      General: He is not in acute distress.    Appearance: Normal appearance. He is well-developed and normal weight. He is not ill-appearing, toxic-appearing or diaphoretic.  HENT:     Head: Normocephalic and atraumatic.     Right Ear: External ear normal.     Left Ear: External ear normal.     Nose: Nose normal.     Mouth/Throat:     Pharynx: Oropharynx is clear.  Eyes:     General: No scleral icterus.       Right eye: No discharge.        Left eye: No discharge.     Extraocular Movements: Extraocular movements intact.     Pupils: Pupils are equal, round, and  reactive to light.  Cardiovascular:     Rate and Rhythm: Normal rate.  Pulmonary:     Effort: Pulmonary effort is normal.  Genitourinary:    Rectum: Tenderness and external hemorrhoid present. No anal fissure.    Musculoskeletal:     Cervical back: Normal range of motion.  Skin:    General: Skin is warm and dry.  Neurological:     Mental Status: He is alert and oriented to person, place, and time.  Psychiatric:        Mood and Affect: Mood normal.        Behavior: Behavior normal.        Thought Content: Thought content normal.        Judgment: Judgment normal.      Assessment and Plan :   PDMP not reviewed this encounter.  1. External hemorrhoid   2. Anal pain   3. Bloody stool     Counseled on general management of external hemorrhoids. Patient admits that he has been eating very and healthily and drinking alcohol more. Recommended hydrocortisone cream, dietary modifications. Use docusate for stool softening properties. Follow-up with PCP, consider  referral to gastroenterology if symptoms persist. Counseled patient on potential for adverse effects with medications prescribed/recommended today, ER and return-to-clinic precautions discussed, patient verbalized understanding.    Wallis Bamberg, PA-C 12/11/19 1630

## 2020-05-02 ENCOUNTER — Ambulatory Visit: Payer: 59

## 2020-12-31 ENCOUNTER — Encounter: Payer: 59 | Admitting: Family Medicine

## 2021-01-15 ENCOUNTER — Other Ambulatory Visit: Payer: Self-pay

## 2021-01-15 ENCOUNTER — Ambulatory Visit (INDEPENDENT_AMBULATORY_CARE_PROVIDER_SITE_OTHER): Payer: 59 | Admitting: Family Medicine

## 2021-01-15 VITALS — BP 130/92 | HR 80 | Temp 98.3°F | Resp 16 | Wt 186.0 lb

## 2021-01-15 DIAGNOSIS — Z0000 Encounter for general adult medical examination without abnormal findings: Secondary | ICD-10-CM

## 2021-01-15 DIAGNOSIS — Z1159 Encounter for screening for other viral diseases: Secondary | ICD-10-CM

## 2021-01-15 NOTE — Progress Notes (Signed)
New Patient Office Visit  Subjective:  Patient ID: Dustin Bruce, male    DOB: 08-15-1980  Age: 40 y.o. MRN: [id number]  CC:  Chief Complaint  Patient presents with   Establish Care    HPI Dustin Bruce presents for routine annual exam. Patient denies acute complaints or concerns.   Past Medical History:  Diagnosis Date   Alopecia     Past Surgical History:  Procedure Laterality Date   ESOPHAGUS SURGERY     age 71;    Family History  Problem Relation Age of Onset   Cancer Neg Hx    Heart disease Neg Hx    Hypertension Neg Hx    Diabetes Neg Hx    Stroke Neg Hx     Social History   Socioeconomic History   Marital status: Married    Spouse name: Not on file   Number of children: Not on file   Years of education: Not on file   Highest education level: Not on file  Occupational History   Not on file  Tobacco Use   Smoking status: Never   Smokeless tobacco: Never  Substance and Sexual Activity   Alcohol use: Yes    Comment: social   Drug use: No   Sexual activity: Not on file  Other Topics Concern   Not on file  Social History Narrative   Married, has 2 children, ages 13yo and 31yo, works in Designer, 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  Objective:   Today's Vitals: BP (!) 130/92 (BP Location: Right Arm, Patient Position: Sitting, Cuff Size: Large)   Pulse 80   Temp 98.3 F (36.8 C) (Oral)   Resp 16   Wt 186 lb (84.4 kg)   SpO2 98%   BMI 29.13 kg/m   Physical Exam Vitals and nursing note reviewed.  Constitutional:      General: He is not in acute distress. HENT:     Head: Normocephalic and  atraumatic.     Right Ear: Tympanic membrane, ear canal and external ear normal.     Left Ear: Tympanic membrane, ear canal and external ear normal.     Nose: Nose normal.     Mouth/Throat:     Mouth: Mucous membranes are moist.     Pharynx: Oropharynx is clear.  Eyes:     Conjunctiva/sclera: Conjunctivae normal.     Pupils: Pupils are equal, round, and reactive to light.  Neck:     Thyroid: No thyromegaly.  Cardiovascular:     Rate and Rhythm: Normal rate and regular rhythm.     Heart sounds: Normal heart sounds. No murmur heard. Pulmonary:     Effort: Pulmonary effort is normal.     Breath sounds: Normal breath sounds.  Abdominal:     General: There is no distension.     Palpations: Abdomen is soft. There is no mass.     Tenderness: There is no abdominal tenderness.     Hernia: There is no hernia in the left inguinal area or right inguinal area.  Genitourinary:    Penis: Normal and circumcised.  Testes: Normal.  Musculoskeletal:        General: Normal range of motion.     Cervical back: Normal range of motion and neck supple.     Right lower leg: No edema.     Left lower leg: No edema.  Skin:    General: Skin is warm and dry.  Neurological:     General: No focal deficit present.     Mental Status: He is alert and oriented to person, place, and time. Mental status is at baseline.  Psychiatric:        Mood and Affect: Mood normal.        Behavior: Behavior normal.    Assessment & Plan:   1. Annual physical exam Unremarkable exam. Routine labs ordered - CMP14+EGFR - CBC with Differential  2. Need for hepatitis C screening test  - Hepatitis C Antibody   Outpatient Encounter Medications as of 01/15/2021  Medication Sig   fluocinolone (SYNALAR) 0.01 % external solution 1 APPLICATION TWICE A DAY MON FRIDAY ONLY EXTERNALLY   Multiple Vitamin (MULTIVITAMIN) tablet Take 1 tablet by mouth daily.   docusate sodium (COLACE) 100 MG capsule Take 1 capsule (100 mg total)  by mouth every 12 (twelve) hours. (Patient not taking: Reported on 01/15/2021)   hydrocortisone (ANUSOL-HC) 2.5 % rectal cream Place 1 application rectally 2 (two) times daily. (Patient not taking: Reported on 01/15/2021)   No facility-administered encounter medications on file as of 01/15/2021.    Follow-up: Return in about 1 year (around 01/15/2022) for physical.   Becky Sax, MD

## 2021-01-15 NOTE — Progress Notes (Signed)
Patient is new to provider.  Patient is her for CPE with no other concerrns

## 2021-01-16 ENCOUNTER — Encounter: Payer: Self-pay | Admitting: Family Medicine

## 2021-01-16 LAB — CBC WITH DIFFERENTIAL/PLATELET
Basophils Absolute: 0 10*3/uL (ref 0.0–0.2)
Basos: 1 %
EOS (ABSOLUTE): 0.3 10*3/uL (ref 0.0–0.4)
Eos: 5 %
Hematocrit: 44.7 % (ref 37.5–51.0)
Hemoglobin: 15.3 g/dL (ref 13.0–17.7)
Immature Grans (Abs): 0 10*3/uL (ref 0.0–0.1)
Immature Granulocytes: 1 %
Lymphocytes Absolute: 2.2 10*3/uL (ref 0.7–3.1)
Lymphs: 34 %
MCH: 29.4 pg (ref 26.6–33.0)
MCHC: 34.2 g/dL (ref 31.5–35.7)
MCV: 86 fL (ref 79–97)
Monocytes Absolute: 0.4 10*3/uL (ref 0.1–0.9)
Monocytes: 6 %
Neutrophils Absolute: 3.6 10*3/uL (ref 1.4–7.0)
Neutrophils: 53 %
Platelets: 239 10*3/uL (ref 150–450)
RBC: 5.2 x10E6/uL (ref 4.14–5.80)
RDW: 12.3 % (ref 11.6–15.4)
WBC: 6.6 10*3/uL (ref 3.4–10.8)

## 2021-01-16 LAB — CMP14+EGFR
ALT: 21 IU/L (ref 0–44)
AST: 17 IU/L (ref 0–40)
Albumin/Globulin Ratio: 2.3 — ABNORMAL HIGH (ref 1.2–2.2)
Albumin: 5.2 g/dL — ABNORMAL HIGH (ref 4.0–5.0)
Alkaline Phosphatase: 58 IU/L (ref 44–121)
BUN/Creatinine Ratio: 11 (ref 9–20)
BUN: 12 mg/dL (ref 6–24)
Bilirubin Total: 1 mg/dL (ref 0.0–1.2)
CO2: 22 mmol/L (ref 20–29)
Calcium: 9.3 mg/dL (ref 8.7–10.2)
Chloride: 99 mmol/L (ref 96–106)
Creatinine, Ser: 1.12 mg/dL (ref 0.76–1.27)
Globulin, Total: 2.3 g/dL (ref 1.5–4.5)
Glucose: 91 mg/dL (ref 65–99)
Potassium: 3.9 mmol/L (ref 3.5–5.2)
Sodium: 136 mmol/L (ref 134–144)
Total Protein: 7.5 g/dL (ref 6.0–8.5)
eGFR: 84 mL/min/{1.73_m2} (ref >59)

## 2021-01-16 LAB — HEPATITIS C ANTIBODY: Hep C Virus Ab: <0.1 s/co ratio (ref 0.0–0.9)

## 2021-11-08 ENCOUNTER — Ambulatory Visit (HOSPITAL_COMMUNITY)
Admission: EM | Admit: 2021-11-08 | Discharge: 2021-11-08 | Disposition: A | Discharge status: Home / Self Care | Payer: BC Managed Care – PPO | Attending: Student | Admitting: Student

## 2021-11-08 DIAGNOSIS — H66002 Acute suppurative otitis media without spontaneous rupture of ear drum, left ear: Secondary | ICD-10-CM | POA: Diagnosis present

## 2021-11-08 DIAGNOSIS — R519 Headache, unspecified: Secondary | ICD-10-CM | POA: Diagnosis present

## 2021-11-08 LAB — C-REACTIVE PROTEIN: CRP: <0.5 mg/dL (ref <1.0)

## 2021-11-08 LAB — SEDIMENTATION RATE: Sed Rate: 4 mm/hr (ref 0–16)

## 2021-11-08 MED ORDER — AMOXICILLIN-POT CLAVULANATE 875-125 MG PO TABS: 1.0000 | ORAL_TABLET | Freq: Two times a day (BID) | ORAL | 0 refills | Status: DC

## 2021-11-08 MED ORDER — PREDNISONE 50 MG PO TABS: 50.0000 mg | ORAL_TABLET | Freq: Every day | ORAL | 0 refills | Status: AC

## 2021-11-08 NOTE — ED Triage Notes (Signed)
Pt presents to [facility] with co of L sided otalgia and headache since wednesday. Pt reports attempting motrin, aleve and goodies powder for pain all with minimal decrease in pain. Last dose was last night. Pt reports pain is sharp and comes and goes 7/10 atm

## 2021-11-08 NOTE — Discharge Instructions (Addendum)
-  Start the antibiotic-Augmentin (amoxicillin-clavulanate), 1 pill every 12 hours for 7 days.  You can take this with food like with breakfast and dinner. -Tylenol for pain  -Prednisone, one pill daily x5 days.  Try taking this earlier in the day as it can give you energy. Avoid NSAIDs like ibuprofen and alleve while taking this medication as they can increase your risk of stomach upset and even GI bleeding when in combination with a steroid. You can continue tylenol (acetaminophen) up to 1000mg  3x daily. -We are screening for a rare condition called temporal arteritis. Temporal arteritis (giant cell arteritis) is where the arteries, particularly those at the side of the head (the temples), become inflamed. It's serious and needs urgent treatment. The prednisone will treat this while awaiting lab results. If new symptoms like vision loss, worst headache of life, dizziness, vision changes, etc - head to the ED or call 911.

## 2021-11-08 NOTE — ED Provider Notes (Signed)
[place]    CSN: [id number] Arrival date & time: 11/08/21  1220      History   Chief Complaint Chief Complaint  Patient presents with   Otalgia   Migraine    HPI Dustin Bruce is a 41 y.o. male presenting with left otalgia and headaches for 2 days.  History noncontributory.  States that the headache is worse over the ear, but there is also some throbbing left temple pain.  Also with photophobia.  States there is some jaw pain with eating, but denies pain inside of the mouth.  Denies hearing changes, dizziness, tinnitus, drainage.  Denies recent URI, cough, congestion, fevers.  Denies acute vision changes, vision loss, new flashes of light or floaters in field of vision.  Denies neck stiffness.  Has attempted Motrin, Aleve, Goody powder with temporary relief. Denies worst headache of life, thunderclap headache, weakness/sensation changes in arms/legs, vision changes, shortness of breath, chest pain/pressure, phonophobia, n/v/d.   HPI  Past Medical History:  Diagnosis Date   Alopecia     There are no problems to display for this patient.   Past Surgical History:  Procedure Laterality Date   ESOPHAGUS SURGERY     age 53;       Home Medications    Prior to Admission medications   Medication Sig Start Date End Date Taking? Authorizing Provider  amoxicillin-clavulanate (AUGMENTIN) 875-125 MG tablet Take 1 tablet by mouth every 12 (twelve) hours. 11/08/21  Yes Hazel Sams, PA-C  predniSONE (DELTASONE) 50 MG tablet Take 1 tablet (50 mg total) by mouth daily for 5 days. Take with breakfast or lunch. Avoid NSAIDs (ibuprofen, etc) while taking this medication. 11/08/21 11/13/21 Yes Hazel Sams, PA-C  fluocinolone (SYNALAR) 0.01 % external solution 1 APPLICATION TWICE A DAY MON FRIDAY ONLY EXTERNALLY 07/03/19   [provider]  Multiple Vitamin (MULTIVITAMIN) tablet Take 1 tablet by mouth daily.    [provider]    Family History Family  History  Problem Relation Age of Onset   Cancer Neg Hx    Heart disease Neg Hx    Hypertension Neg Hx    Diabetes Neg Hx    Stroke Neg Hx     Social History Social History   Tobacco Use   Smoking status: Never   Smokeless tobacco: Never  Substance Use Topics   Alcohol use: Yes    Comment: social   Drug use: No     Allergies   Patient has no known allergies.   Review of Systems Review of Systems  HENT:  Positive for ear pain.   Neurological:  Positive for headaches.  All other systems reviewed and are negative.    Physical Exam Triage Vital Signs ED Triage Vitals  Enc Vitals Group     BP 11/08/21 1307 (!) 133/94     Pulse Rate 11/08/21 1307 92     Resp 11/08/21 1307 16     Temp 11/08/21 1307 98.2 F (36.8 C)     Temp src --      SpO2 11/08/21 1307 98 %     Weight --      Height --      Head Circumference --      Peak Flow --      Pain Score 11/08/21 1306 7     Pain Loc --      Pain Edu? --      Excl. in [place]? --    No data found.  Updated Vital Signs BP (!) 133/94   Pulse 92   Temp 98.2 F (36.8 C)   Resp 16   SpO2 98%   Visual Acuity Right Eye Distance:   Left Eye Distance:   Bilateral Distance:    Right Eye Near:   Left Eye Near:    Bilateral Near:     Physical Exam Vitals reviewed.  Constitutional:      Appearance: Normal appearance. He is not ill-appearing.  HENT:     Head: Normocephalic and atraumatic.     Comments: Temporal arteries are not enlarged or palpable. No scalp tenderness.     Right Ear: Hearing, tympanic membrane, ear canal and external ear normal. No swelling or tenderness. No middle ear effusion. There is no impacted cerumen. No mastoid tenderness. Tympanic membrane is not injected, scarred, perforated, erythematous, retracted or bulging.     Left Ear: Hearing, ear canal and external ear normal. No swelling or tenderness.  No middle ear effusion. There is no impacted cerumen. No mastoid tenderness. Tympanic membrane is  erythematous. Tympanic membrane is not injected, scarred, perforated, retracted or bulging.     Ears:     Comments: L canal is erythematous, and TM is dull.     Mouth/Throat:     Pharynx: Oropharynx is clear. No oropharyngeal exudate or posterior oropharyngeal erythema.     Comments: No dental infection  Eyes:     General: Lids are normal. Vision grossly intact. No allergic shiner, visual field deficit or scleral icterus.       Right eye: No foreign body, discharge or hordeolum.        Left eye: No foreign body, discharge or hordeolum.     Extraocular Movements: Extraocular movements intact.     Conjunctiva/sclera:     Right eye: Right conjunctiva is not injected. No chemosis, exudate or hemorrhage.    Left eye: Left conjunctiva is not injected. No chemosis, exudate or hemorrhage.    Comments: PERRLA, EOMI No conjunctival injection or lid changes. Visual acuity grossly intact   Cardiovascular:     Rate and Rhythm: Normal rate and regular rhythm.     Heart sounds: Normal heart sounds.  Pulmonary:     Effort: Pulmonary effort is normal.     Breath sounds: Normal breath sounds.  Lymphadenopathy:     Cervical: No cervical adenopathy.  Neurological:     General: No focal deficit present.     Mental Status: He is alert and oriented to person, place, and time.     Comments: PERRLA, EOMI. CN 2-12 grossly intact. Strength and sensation grossly intact. Gait intact.   Psychiatric:        Mood and Affect: Mood normal.        Behavior: Behavior normal.        Thought Content: Thought content normal.        Judgment: Judgment normal.      UC Treatments / Results  Labs (all labs ordered are listed, but only abnormal results are displayed) Labs Reviewed  SEDIMENTATION RATE  C-REACTIVE PROTEIN    EKG   Radiology No results found.  Procedures Procedures (including critical care time)  Medications Ordered in UC Medications - No data to display  Initial Impression / Assessment  and Plan / UC Course  I have reviewed the triage vital signs and the nursing notes.  Pertinent labs & imaging results that were available during my care of the patient were reviewed by me and considered in my medical  decision making (see chart for details).     This patient is a very pleasant 41 y.o. year old male presenting with L AOM and temporal headache. Afebrile, nontachy. Neurologically intact. Symptoms improve with OTC NSAIDs but then return. He does not have acute scalp tenderness, vision changes/loss or neck stiffness, but given throbbing pain localized over the L temple will also exclude temporal arteritis with ESR and CRP; high-dose prednisone while awaiting lab results. Avoid NSAIDs while on this medication. Augmentin sent for infection component. Strict ED return precautions discussed- vision changes, vision loss, new dizziness, new worst headache of life, etc. Patient verbalizes understanding and agreement.   Final Clinical Impressions(s) / UC Diagnoses   Final diagnoses:  Non-recurrent acute suppurative otitis media of left ear without spontaneous rupture of tympanic membrane  Bad headache     Discharge Instructions      -Start the antibiotic-Augmentin (amoxicillin-clavulanate), 1 pill every 12 hours for 7 days.  You can take this with food like with breakfast and dinner. -Tylenol for pain  -Prednisone, one pill daily x5 days.  Try taking this earlier in the day as it can give you energy. Avoid NSAIDs like ibuprofen and alleve while taking this medication as they can increase your risk of stomach upset and even GI bleeding when in combination with a steroid. You can continue tylenol (acetaminophen) up to 1029m 3x daily. -We are screening for a rare condition called temporal arteritis. Temporal arteritis (giant cell arteritis) is where the arteries, particularly those at the side of the head (the temples), become inflamed. It's serious and needs urgent treatment. The prednisone  will treat this while awaiting lab results. If new symptoms like vision loss, worst headache of life, dizziness, vision changes, etc - head to the ED or call 911.    ED Prescriptions     Medication Sig Dispense Auth. Provider   amoxicillin-clavulanate (AUGMENTIN) 875-125 MG tablet Take 1 tablet by mouth every 12 (twelve) hours. 14 tablet GHazel Sams PA-C   predniSONE (DELTASONE) 50 MG tablet Take 1 tablet (50 mg total) by mouth daily for 5 days. Take with breakfast or lunch. Avoid NSAIDs (ibuprofen, etc) while taking this medication. 5 tablet GHazel Sams PA-C      PDMP not reviewed this encounter.   GHazel Sams PA-C 11/08/21 1354

## 2021-11-10 ENCOUNTER — Telehealth (HOSPITAL_COMMUNITY): Payer: Self-pay | Admitting: Emergency Medicine

## 2021-11-10 ENCOUNTER — Telehealth: Payer: Self-pay | Admitting: Family Medicine

## 2021-11-10 NOTE — Telephone Encounter (Signed)
Patient returned call, everything reviewed, f/u discussed

## 2021-11-10 NOTE — Telephone Encounter (Signed)
Returned pt's call regarding scheduling Annual Physical with PCP.  Last Physical was 01/15/21. Next should be about 01/18/22.   I also called to inquire if pt had another concern that needed to be addressed before September to be seen sooner.   I couldn't leave a voice msg.

## 2021-11-10 NOTE — Telephone Encounter (Signed)
Patient left voicemail looking for results review from recent visit. Attempted return call x 1, LVM

## 2021-11-12 ENCOUNTER — Encounter: Payer: Self-pay | Admitting: Family Medicine

## 2021-11-12 ENCOUNTER — Ambulatory Visit (INDEPENDENT_AMBULATORY_CARE_PROVIDER_SITE_OTHER): Payer: BC Managed Care – PPO | Admitting: Family Medicine

## 2021-11-12 VITALS — BP 142/97 | HR 98 | Temp 98.6°F | Resp 16 | Wt 197.4 lb

## 2021-11-12 DIAGNOSIS — B0230 Zoster ocular disease, unspecified: Secondary | ICD-10-CM

## 2021-11-12 MED ORDER — VALACYCLOVIR HCL 1 G PO TABS: 1000.0000 mg | ORAL_TABLET | Freq: Two times a day (BID) | ORAL | 0 refills | Status: AC

## 2021-11-12 MED ORDER — OXYCODONE-ACETAMINOPHEN 5-325 MG PO TABS: 1.0000 | ORAL_TABLET | ORAL | 0 refills | Status: AC | PRN

## 2021-11-12 NOTE — Progress Notes (Signed)
Patient came in with a possible allergic reaction to medication Amoxicillin and/or prednisone   Left side of face swollen and left eye swollen .

## 2021-11-12 NOTE — Progress Notes (Signed)
Established Patient Office Visit  Subjective    Patient ID: Dustin Bruce, male    DOB: 04-Mar-1981  Age: 41 y.o. MRN: [id number]  CC:  Chief Complaint  Patient presents with   Allergic Reaction    HPI [name] presents for complaint of facial rash that is painful. Patient started with a headache and left ear pain a week ago. Was seen at UC 4 days ago 2/2 pain and was put on amox and prednisone. Some improvement of pain for about 12 hours then a breakout of rash over his left eye and forehead that is incredibly painful. Patient denies known contacts or exposures. Denies fever/chills or viral sx. Patient stopped meds on day 2 when sx broke out on face.    Outpatient Encounter Medications as of 11/12/2021  Medication Sig   oxyCODONE-acetaminophen (PERCOCET/ROXICET) 5-325 MG tablet Take 1 tablet by mouth every 4 (four) hours as needed for up to 5 days for severe pain.   valACYclovir (VALTREX) 1000 MG tablet Take 1 tablet (1,000 mg total) by mouth 2 (two) times daily for 10 days.   amoxicillin-clavulanate (AUGMENTIN) 875-125 MG tablet Take 1 tablet by mouth every 12 (twelve) hours.   fluocinolone (SYNALAR) 0.01 % external solution 1 APPLICATION TWICE A DAY MON FRIDAY ONLY EXTERNALLY   Multiple Vitamin (MULTIVITAMIN) tablet Take 1 tablet by mouth daily.   predniSONE (DELTASONE) 50 MG tablet Take 1 tablet (50 mg total) by mouth daily for 5 days. Take with breakfast or lunch. Avoid NSAIDs (ibuprofen, etc) while taking this medication.   No facility-administered encounter medications on file as of 11/12/2021.    Past Medical History:  Diagnosis Date   Alopecia     Past Surgical History:  Procedure Laterality Date   ESOPHAGUS SURGERY     age 55;    Family History  Problem Relation Age of Onset   Cancer Neg Hx    Heart disease Neg Hx    Hypertension Neg Hx    Diabetes Neg Hx    Stroke Neg Hx     Social History   Socioeconomic History   Marital status: Married    Spouse  name: Not on file   Number of children: Not on file   Years of education: Not on file   Highest education level: Not on file  Occupational History   Not on file  Tobacco Use   Smoking status: Never   Smokeless tobacco: Never  Substance and Sexual Activity   Alcohol use: Yes    Comment: social   Drug use: No   Sexual activity: Not on file  Other Topics Concern   Not on file  Social History Narrative   Married, has 2 children, ages 52yo and 32yo, 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 Negative for double vision and redness.  All other systems reviewed and are negative.       Objective    BP (!) 142/97   Pulse 98   Temp 98.6 F (37 C) (Oral)   Resp 16   Wt 197 lb 6.4 oz (89.5 kg)   SpO2  97%   BMI 30.92 kg/m   Physical Exam Vitals and nursing note reviewed.  Constitutional:      General: He is not in acute distress. Eyes:     Comments: Left lid is  Cardiovascular:     Rate and Rhythm: Normal rate and regular rhythm.  Pulmonary:     Effort: Pulmonary effort is normal.     Breath sounds: Normal breath sounds.  Abdominal:     Palpations: Abdomen is soft.     Tenderness: There is no abdominal tenderness.  Skin:    Findings: Rash present. Rash is vesicular.     Comments: Located above right eye on forehead along dermatomes   Neurological:     General: No focal deficit present.     Mental Status: He is alert and oriented to person, place, and time.         Assessment & Plan:   1. Herpes zoster with ophthalmic complication, unspecified herpes zoster eye disease Valtrex and percocet prescribed. Patient was  made an urgent appt with eye for further eval/mgt Return in about 8 days (around 11/20/2021) for follow up.   Tommie Raymond, MD

## 2021-11-17 ENCOUNTER — Ambulatory Visit: Payer: Self-pay

## 2021-11-17 NOTE — Telephone Encounter (Signed)
  Chief Complaint: shingles fu Symptoms: still having L eye pain and nerve pain, tenderness to head  Frequency: ongoing for 2 weeks Pertinent Negatives: NA Disposition: [] ED /[] Urgent Care (no appt availability in office) / [x] Appointment(In office/virtual)/ []  [facility]/ [] Home Care/ [] Refused Recommended Disposition /[] [facility]/ []  Follow-up with PCP Additional Notes: pt wanted to schedule a fu appt to make sure everything is healing appropriately but he is out of oxycodone now and requesting gabapentin more for the nerve pain to see if that will help better. Pt doesn't want a refill on Oxycodone d/t wanting something not as strong to help with nerve pain. Pt states pain has gotten some better and isn't as constant anymore. Scheduled pt for fu appt on 11/30/21 at 1540 with Amy, NP and advised I will send message back for request of gabapentin. Pt and wife verbalized understanding and would like CB to let them know if medication gets sent in or not.    Reason for Disposition  MILD eye pains are a recurrent problem  Answer Assessment - Initial Assessment Questions 1. ONSET: "When did the pain start?" (e.g., minutes, hours, days)     Since 11/10/21 2. TIMING: "Does the pain come and go, or has it been constant since it started?" (e.g., constant, intermittent, fleeting)     Comes and goes  4. LOCATION: "Where does it hurt?"  (e.g., eyelid, eye, cheekbone)     L eye pain 5. CAUSE: "What do you think is causing the pain?"     shingles 9. OTHER SYMPTOMS: "Do you have any other symptoms?" (e.g., headache, nasal discharge, facial rash)     Feeling nerve pain related to shingles  Protocols used: Eye Pain and Other Symptoms-A-AH

## 2021-11-18 ENCOUNTER — Telehealth: Payer: Self-pay | Admitting: Family Medicine

## 2021-11-18 NOTE — Telephone Encounter (Signed)
Copied from CRM [id number]. Topic: General - Other >> Nov 18, 2021  2:02 PM Ja-Kwan M wrote: Reason for CRM: Pt wife stated a message was left with a nurse so she was calling to follow up on Rx request because pt is in pain and needs the Rx to be sent in. Pt wife stated she does not know what the expectation is when the pt can not get an appt until the following week and their messages are not been responded to. PT wife requests call back asap to advise.

## 2021-11-19 ENCOUNTER — Ambulatory Visit: Payer: Self-pay | Admitting: *Deleted

## 2021-11-19 ENCOUNTER — Other Ambulatory Visit: Payer: Self-pay | Admitting: Family Medicine

## 2021-11-19 MED ORDER — GABAPENTIN 300 MG PO CAPS: 300.0000 mg | ORAL_CAPSULE | Freq: Three times a day (TID) | ORAL | 0 refills | Status: DC

## 2021-11-19 NOTE — Telephone Encounter (Signed)
Reason for Disposition  [1] Shingles rash AND [2] onset > 72 hours ago (3 days)  Answer Assessment - Initial Assessment Questions 1. APPEARANCE of RASH: "Describe the rash."      Been diagnosed with Shingles.   He is still in pain.   He was referred to othomalogist for his eye.  He is having nerve pain.   He needs something for the nerve pain.   Oxycodone is not helping.    2. LOCATION: "Where is the rash located?"      Eye.    3. ONSET: "When did the rash start?"      Already been diagnosed.     It's been 3 days since I called in regarding this.   No one has called [place] back.   4. ITCHING: "Does the rash itch?" If Yes, ask: "How bad is the itch?"  (Scale 1-10; or mild, moderate, severe)     Not asked 5. PAIN: "Does the rash hurt?" If Yes, ask: "How bad is the pain?"  (Scale 0-10; or none, mild, moderate, severe)    - NONE (0): no pain    - MILD (1-3): doesn't interfere with normal activities     - MODERATE (4-7): interferes with normal activities or awakens from sleep     - SEVERE (8-10): excruciating pain, unable to do any normal activities     Severe pain due to Shingles 6. OTHER SYMPTOMS: "Do you have any other symptoms?" (e.g., fever)     Not asked 7. PREGNANCY: "Is there any chance you are pregnant?" "When was your last menstrual period?"     Not asked  Protocols used: Shingles (Zoster)-A-AH

## 2021-11-19 NOTE — Telephone Encounter (Addendum)
  Chief Complaint: Has Shingles.  Dr. Andrey Campanile has diagnosed him with Shingles.   He is needing pain medication for the nerve pain.   Dustin Bruce wife is calling in for him because he is sleeping.   "He has been in so much pain he is having a hard time sleeping".   Arnell was seen by Dr. Andrey Campanile on 11/12/2021.  Diagnosed with Shingles and referred to eye dr. Due to location of rash.   Eye dr. York Spaniel eye was ok but that Dr. Andrey Campanile could prescribe something for the nerve pain.   Wife called in the name of the medication the eye dr. Recommended on Tuesday of this week for Dr. Andrey Campanile.   She is upset that she has not heard anything back from Dr. Andrey Campanile.   She spoke with a triage nurse yesterday also and still has not heard anything.   "My husband is in a lot of pain with these Shingles".   I just need Dr. Andrey Campanile to call in something for the nerve pain.    Synetta Fail is please requesting that Dr. Andrey Campanile prescribe something for her husband's Shingles nerve pain.   The oxycodone is not really helping the nerve pain.  Symptoms: Shingles pain   Saw eye dr that Dr. Andrey Campanile referred him to and eye is fine. Frequency: Symptoms started last Monday Pertinent Negatives: Patient denies N/A Disposition: [] ED /[] Urgent Care (no appt availability in office) / [] Appointment(In office/virtual)/ []  [facility]/ [] Home Care/ [] Refused Recommended Disposition /[] [facility]/ [x]  Follow-up with PCP Additional Notes: I let know I'm sending a high priority message to Dr. and that I'm letting her know you have been waiting since Tuesday for a response.     I called into [facility] and spoke with the flow coordinator about the situation and not hearing back from Dr. .   She is going to send the request directly to Dr. for a response.    I thanked her for her help in this matter.

## 2021-11-20 NOTE — Telephone Encounter (Signed)
LM on Pts VM that RX had been sent in.

## 2021-11-20 NOTE — Telephone Encounter (Signed)
Please advise patient.  

## 2021-11-20 NOTE — Telephone Encounter (Signed)
Please advise this patient 

## 2021-11-30 ENCOUNTER — Ambulatory Visit: Payer: BC Managed Care – PPO | Admitting: Family Medicine

## 2021-11-30 NOTE — Telephone Encounter (Signed)
Paperwork received on 11/30/2021. Patient would need appt for completion.  Copied from CRM [id number]. Topic: General - Other >> Nov 30, 2021  9:56 AM Lyman Speller wrote: Reason for CRM: Pt will fax over FMLA paper he needs completed for his 7.20.23 visit / please advise

## 2021-12-02 NOTE — Telephone Encounter (Unsigned)
Copied from CRM [id number]. Topic: Appointment Scheduling - Scheduling Inquiry for Clinic >> Dec 01, 2021  8:35 AM Haroldine Laws wrote: Reason for CRM: Pt was told to schedule an appt to [facility] paperwork.  The first appt available was aug 24.  He needs one sooner if possible   [phone]

## 2021-12-17 ENCOUNTER — Ambulatory Visit (INDEPENDENT_AMBULATORY_CARE_PROVIDER_SITE_OTHER): Payer: BC Managed Care – PPO | Admitting: Family Medicine

## 2021-12-17 ENCOUNTER — Encounter: Payer: Self-pay | Admitting: Family Medicine

## 2021-12-17 VITALS — BP 128/89 | HR 82 | Temp 97.7°F | Resp 16 | Ht 66.0 in | Wt 197.0 lb

## 2021-12-17 DIAGNOSIS — L700 Acne vulgaris: Secondary | ICD-10-CM | POA: Insufficient documentation

## 2021-12-17 DIAGNOSIS — Z0000 Encounter for general adult medical examination without abnormal findings: Secondary | ICD-10-CM

## 2021-12-17 DIAGNOSIS — L639 Alopecia areata, unspecified: Secondary | ICD-10-CM | POA: Insufficient documentation

## 2021-12-17 DIAGNOSIS — Z130 Encounter for screening for diseases of the blood and blood-forming organs and certain disorders involving the immune mechanism: Secondary | ICD-10-CM

## 2021-12-17 DIAGNOSIS — Z13220 Encounter for screening for lipoid disorders: Secondary | ICD-10-CM

## 2021-12-18 ENCOUNTER — Encounter: Payer: Self-pay | Admitting: Family Medicine

## 2021-12-18 LAB — CMP14+EGFR
ALT: 37 IU/L (ref 0–44)
AST: 22 IU/L (ref 0–40)
Albumin/Globulin Ratio: 1.9 (ref 1.2–2.2)
Albumin: 4.6 g/dL (ref 4.1–5.1)
Alkaline Phosphatase: 58 IU/L (ref 44–121)
BUN/Creatinine Ratio: 12 (ref 9–20)
BUN: 12 mg/dL (ref 6–24)
Bilirubin Total: 0.6 mg/dL (ref 0.0–1.2)
CO2: 21 mmol/L (ref 20–29)
Calcium: 9.6 mg/dL (ref 8.7–10.2)
Chloride: 103 mmol/L (ref 96–106)
Creatinine, Ser: 1.04 mg/dL (ref 0.76–1.27)
Globulin, Total: 2.4 g/dL (ref 1.5–4.5)
Glucose: 96 mg/dL (ref 70–99)
Potassium: 4.1 mmol/L (ref 3.5–5.2)
Sodium: 138 mmol/L (ref 134–144)
Total Protein: 7 g/dL (ref 6.0–8.5)
eGFR: 91 mL/min/{1.73_m2} (ref >59)

## 2021-12-18 LAB — LIPID PANEL
Chol/HDL Ratio: 6 ratio — ABNORMAL HIGH (ref 0.0–5.0)
Cholesterol, Total: 198 mg/dL (ref 100–199)
HDL: 33 mg/dL — ABNORMAL LOW (ref >39)
LDL Chol Calc (NIH): 114 mg/dL — ABNORMAL HIGH (ref 0–99)
Triglycerides: 293 mg/dL — ABNORMAL HIGH (ref 0–149)
VLDL Cholesterol Cal: 51 mg/dL — ABNORMAL HIGH (ref 5–40)

## 2021-12-18 LAB — CBC WITH DIFFERENTIAL/PLATELET
Basophils Absolute: 0 10*3/uL (ref 0.0–0.2)
Basos: 1 %
EOS (ABSOLUTE): 0.3 10*3/uL (ref 0.0–0.4)
Eos: 5 %
Hematocrit: 41.6 % (ref 37.5–51.0)
Hemoglobin: 14.2 g/dL (ref 13.0–17.7)
Immature Grans (Abs): 0 10*3/uL (ref 0.0–0.1)
Immature Granulocytes: 0 %
Lymphocytes Absolute: 2.2 10*3/uL (ref 0.7–3.1)
Lymphs: 40 %
MCH: 29.7 pg (ref 26.6–33.0)
MCHC: 34.1 g/dL (ref 31.5–35.7)
MCV: 87 fL (ref 79–97)
Monocytes Absolute: 0.4 10*3/uL (ref 0.1–0.9)
Monocytes: 8 %
Neutrophils Absolute: 2.5 10*3/uL (ref 1.4–7.0)
Neutrophils: 46 %
Platelets: 203 10*3/uL (ref 150–450)
RBC: 4.78 x10E6/uL (ref 4.14–5.80)
RDW: 12.9 % (ref 11.6–15.4)
WBC: 5.4 10*3/uL (ref 3.4–10.8)

## 2021-12-18 LAB — TSH: TSH: 2.54 u[IU]/mL (ref 0.450–4.500)

## 2021-12-18 LAB — HEMOGLOBIN A1C
Est. average glucose Bld gHb Est-mCnc: 134 mg/dL
Hgb A1c MFr Bld: 6.3 % — ABNORMAL HIGH (ref 4.8–5.6)

## 2021-12-18 MED ORDER — ROSUVASTATIN CALCIUM 10 MG PO TABS: 10.0000 mg | ORAL_TABLET | Freq: Every day | ORAL | 0 refills | Status: DC

## 2021-12-18 NOTE — Progress Notes (Signed)
Established Patient Office Visit  Subjective    Patient ID: Dustin Bruce, male    DOB: 1981-02-24  Age: 41 y.o. MRN: [id number]  CC:  Chief Complaint  Patient presents with   FMLA    HPI Dustin Bruce presents for routine annual exam. Patient denies acute complaints/concerns.    Outpatient Encounter Medications as of 12/17/2021  Medication Sig   Multiple Vitamin (MULTIVITAMIN) tablet Take 1 tablet by mouth daily. (Patient not taking: Reported on 12/17/2021)   [DISCONTINUED] amoxicillin-clavulanate (AUGMENTIN) 875-125 MG tablet Take 1 tablet by mouth every 12 (twelve) hours.   [DISCONTINUED] fluocinolone (SYNALAR) 0.01 % external solution 1 APPLICATION TWICE A DAY MON FRIDAY ONLY EXTERNALLY   [DISCONTINUED] gabapentin (NEURONTIN) 300 MG capsule Take 1 capsule (300 mg total) by mouth 3 (three) times daily.   No facility-administered encounter medications on file as of 12/17/2021.    Past Medical History:  Diagnosis Date   Alopecia     Past Surgical History:  Procedure Laterality Date   ESOPHAGUS SURGERY     age 62;    Family History  Problem Relation Age of Onset   Cancer Neg Hx    Heart disease Neg Hx    Hypertension Neg Hx    Diabetes Neg Hx    Stroke Neg Hx     Social History   Socioeconomic History   Marital status: Married    Spouse name: Not on file   Number of children: Not on file   Years of education: Not on file   Highest education level: Not on file  Occupational History   Not on file  Tobacco Use   Smoking status: Never   Smokeless tobacco: Never  Substance and Sexual Activity   Alcohol use: Yes    Comment: social   Drug use: No   Sexual activity: Not on file  Other Topics Concern   Not on file  Social History Narrative   Married, has 2 children, ages 94yo and 57yo, works in Designer, 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       Objective    BP 128/89   Pulse 82   Temp 97.7 F (36.5 C) (Oral)   Resp 16   Ht 5' 6"  (1.676 m)   Wt 197 lb (89.4 kg)   SpO2 95%   BMI 31.80 kg/m   Physical Exam Vitals and nursing note reviewed.  Constitutional:      General: He is not in acute distress. HENT:     Head: Normocephalic and atraumatic.     Right Ear: Tympanic membrane, ear canal and external ear normal.     Left Ear: Tympanic membrane, ear canal and external ear normal.     Nose: Nose normal.     Mouth/Throat:     Mouth: Mucous membranes are moist.     Pharynx: Oropharynx is clear.  Eyes:     Conjunctiva/sclera: Conjunctivae normal.     Pupils: Pupils are equal, round, and reactive to light.  Neck:     Thyroid: No thyromegaly.  Cardiovascular:     Rate and Rhythm: Normal rate  and regular rhythm.     Heart sounds: Normal heart sounds. No murmur heard. Pulmonary:     Effort: Pulmonary effort is normal.     Breath sounds: Normal breath sounds.  Abdominal:     General: There is no distension.     Palpations: Abdomen is soft. There is no mass.     Tenderness: There is no abdominal tenderness.     Hernia: There is no hernia in the left inguinal area or right inguinal area.  Genitourinary:    Penis: Normal and circumcised.      Testes: Normal.  Musculoskeletal:        General: Normal range of motion.     Cervical back: Normal range of motion and neck supple.     Right lower leg: No edema.     Left lower leg: No edema.  Skin:    General: Skin is warm and dry.  Neurological:     General: No focal deficit present.     Mental Status: He is alert and oriented to person, place, and time. Mental status is at baseline.  Psychiatric:        Mood and Affect: Mood  normal.        Behavior: Behavior normal.         Assessment & Plan:   1. Annual physical exam  - CMP14+EGFR  2. Screening for deficiency anemia  - CBC with Differential  3. Screening for lipid disorders  - Lipid Panel  4. Screening for endocrine/metabolic/immunity disorders  - Hemoglobin A1c - TSH  No follow-ups on file.   Becky Sax, MD

## 2021-12-21 ENCOUNTER — Encounter: Payer: Self-pay | Admitting: *Deleted

## 2021-12-21 NOTE — Progress Notes (Signed)
Letter sent to patient.

## 2022-03-01 ENCOUNTER — Telehealth: Payer: Self-pay

## 2022-03-01 NOTE — Telephone Encounter (Signed)
Pt given lab results per notes of Dr. Redmond Pulling on 03/01/22. Pt verbalized understanding. Pt is asking for print out of labs for employer. Called and spoke to [place], [facility] who advised she would let her nurse know and pt would get CB when ready for pu. CB # is [phone].

## 2022-03-01 NOTE — Telephone Encounter (Signed)
Pt called and would like to come by and pick up a print out of his labs from his August visit. They are for his employer. Please call pt at [phone] when they are up front and ready to pick up

## 2022-03-04 ENCOUNTER — Encounter: Payer: Self-pay | Admitting: Family Medicine

## 2022-03-04 NOTE — Telephone Encounter (Signed)
Pt picked up paperwork today.

## 2022-03-04 NOTE — Telephone Encounter (Addendum)
Pt is calling back to follow up. Pt called and would like to come by and pick up a printout of his labs from his August visit. They are for his employer. Please call pt at [phone] when they are up front and ready to pick up.   Please advise.

## 2022-03-22 NOTE — Progress Notes (Signed)
error 

## 2022-12-17 ENCOUNTER — Ambulatory Visit (INDEPENDENT_AMBULATORY_CARE_PROVIDER_SITE_OTHER): Payer: BC Managed Care – PPO | Admitting: Family Medicine

## 2022-12-17 VITALS — BP 137/85 | HR 93 | Temp 98.1°F | Resp 16 | Ht 66.0 in | Wt 194.2 lb

## 2022-12-17 DIAGNOSIS — R0683 Snoring: Secondary | ICD-10-CM

## 2022-12-17 NOTE — Progress Notes (Unsigned)
Established Patient Office Visit  Subjective    Patient ID: Dustin Bruce, male    DOB: 1980-06-26  Age: 42 y.o. MRN: [id number]  CC:  Chief Complaint  Patient presents with   Sleep Apnea    Patient having trouble when sleep so patient wants to be check for sleep apnea    HPI Dustin Bruce presents for complaint of snoring and daytime sleepiness.   Outpatient Encounter Medications as of 12/17/2022  Medication Sig   rosuvastatin (CRESTOR) 10 MG tablet Take 1 tablet (10 mg total) by mouth daily. (Patient not taking: Reported on 12/17/2022)   No facility-administered encounter medications on file as of 12/17/2022.    Past Medical History:  Diagnosis Date   Alopecia     Past Surgical History:  Procedure Laterality Date   ESOPHAGUS SURGERY     age 5;    Family History  Problem Relation Age of Onset   Cancer Neg Hx    Heart disease Neg Hx    Hypertension Neg Hx    Diabetes Neg Hx    Stroke Neg Hx     Social History   Socioeconomic History   Marital status: Married    Spouse name: Not on file   Number of children: Not on file   Years of education: Not on file   Highest education level: Associate degree: academic program  Occupational History   Not on file  Tobacco Use   Smoking status: Never   Smokeless tobacco: Never  Substance and Sexual Activity   Alcohol use: Yes    Comment: social   Drug use: No   Sexual activity: Not on file  Other Topics Concern   Not on file  Social History Narrative   Married, has 2 children, ages 42yo and 71yo, works in [employer] support Time [employer], exercise - infrequently   Social Determinants of Health   Financial Resource Strain: Low Risk  (12/17/2022)   Overall Financial Resource Strain (CARDIA)    Difficulty of Paying Living Expenses: Not very hard  Food Insecurity: No Food Insecurity (12/17/2022)   Hunger Vital Sign    Worried About Running Out of Food in the Last Year: Never true    Ran Out of Food in the Last Year:  Never true  Transportation Needs: No Transportation Needs (12/17/2022)   PRAPARE - Administrator, Civil Service (Medical): No    Lack of Transportation (Non-Medical): No  Physical Activity: Insufficiently Active (12/17/2022)   Exercise Vital Sign    Days of Exercise per Week: 4 days    Minutes of Exercise per Session: 30 min  Stress: No Stress Concern Present (12/17/2022)   [employer] of Occupational Health - Occupational Stress Questionnaire    Feeling of Stress : Only a little  Social Connections: Moderately Isolated (12/17/2022)   Social Connection and Isolation Panel [NHANES]    Frequency of Communication with Friends and Family: Once a week    Frequency of Social Gatherings with Friends and Family: Once a week    Attends Religious Services: More than 4 times per year    Active Member of [employer] or Organizations: No    Attends Engineer, structural: Not on file    Marital Status: Married  Catering manager Violence: Not on file    Review of Systems  All other systems reviewed and are negative.       Objective    BP 137/85 (BP Location: Left Arm, Patient Position: Sitting,  Cuff Size: Normal)   Pulse 93   Temp 98.1 F (36.7 C) (Oral)   Resp 16   Ht 5\' 6"  (1.676 m)   Wt 194 lb 3.2 oz (88.1 kg)   SpO2 97%   BMI 31.34 kg/m   Physical Exam Vitals and nursing note reviewed.  Constitutional:      General: He is not in acute distress. Cardiovascular:     Rate and Rhythm: Normal rate and regular rhythm.  Pulmonary:     Effort: Pulmonary effort is normal.     Breath sounds: Normal breath sounds.  Abdominal:     Tenderness: There is no abdominal tenderness.  Neurological:     General: No focal deficit present.     Mental Status: He is alert and oriented to person, place, and time.         Assessment & Plan:   1. Snoring Patient referred for sleep study for possible sleep apnea - [facility] Sleep Study; Future    Return if symptoms worsen or  fail to improve.   Tommie Raymond, MD

## 2022-12-17 NOTE — Progress Notes (Unsigned)
Patient having trouble when sleep so patient wants to be check for sleep apnea

## 2022-12-20 ENCOUNTER — Encounter: Payer: Self-pay | Admitting: Family Medicine

## 2023-05-04 ENCOUNTER — Encounter (HOSPITAL_BASED_OUTPATIENT_CLINIC_OR_DEPARTMENT_OTHER): Payer: Self-pay | Admitting: Emergency Medicine

## 2023-05-04 ENCOUNTER — Other Ambulatory Visit: Payer: Self-pay

## 2023-05-04 ENCOUNTER — Emergency Department (HOSPITAL_BASED_OUTPATIENT_CLINIC_OR_DEPARTMENT_OTHER)
Admission: EM | Admit: 2023-05-04 | Discharge: 2023-05-04 | Disposition: A | Discharge status: Home / Self Care | Payer: BC Managed Care – PPO | Attending: Emergency Medicine | Admitting: Emergency Medicine

## 2023-05-04 ENCOUNTER — Emergency Department (HOSPITAL_BASED_OUTPATIENT_CLINIC_OR_DEPARTMENT_OTHER): Payer: BC Managed Care – PPO

## 2023-05-04 DIAGNOSIS — R109 Unspecified abdominal pain: Secondary | ICD-10-CM

## 2023-05-04 DIAGNOSIS — N200 Calculus of kidney: Secondary | ICD-10-CM | POA: Diagnosis not present

## 2023-05-04 LAB — BASIC METABOLIC PANEL
Anion gap: 7 (ref 5–15)
BUN: 12 mg/dL (ref 6–20)
CO2: 25 mmol/L (ref 22–32)
Calcium: 9.2 mg/dL (ref 8.9–10.3)
Chloride: 97 mmol/L — ABNORMAL LOW (ref 98–111)
Creatinine, Ser: 1.17 mg/dL (ref 0.61–1.24)
GFR, Estimated: >60 mL/min (ref >60)
Glucose, Bld: 92 mg/dL (ref 70–99)
Potassium: 3.8 mmol/L (ref 3.5–5.1)
Sodium: 129 mmol/L — ABNORMAL LOW (ref 135–145)

## 2023-05-04 LAB — CBC WITH DIFFERENTIAL/PLATELET
Abs Immature Granulocytes: 0.01 10*3/uL (ref 0.00–0.07)
Basophils Absolute: 0 10*3/uL (ref 0.0–0.1)
Basophils Relative: 0 %
Eosinophils Absolute: 0.3 10*3/uL (ref 0.0–0.5)
Eosinophils Relative: 5 %
HCT: 44 % (ref 39.0–52.0)
Hemoglobin: 15 g/dL (ref 13.0–17.0)
Immature Granulocytes: 0 %
Lymphocytes Relative: 42 %
Lymphs Abs: 1.9 10*3/uL (ref 0.7–4.0)
MCH: 29.4 pg (ref 26.0–34.0)
MCHC: 34.1 g/dL (ref 30.0–36.0)
MCV: 86.1 fL (ref 80.0–100.0)
Monocytes Absolute: 0.4 10*3/uL (ref 0.1–1.0)
Monocytes Relative: 9 %
Neutro Abs: 2 10*3/uL (ref 1.7–7.7)
Neutrophils Relative %: 44 %
Platelets: 228 10*3/uL (ref 150–400)
RBC: 5.11 MIL/uL (ref 4.22–5.81)
RDW: 12.4 % (ref 11.5–15.5)
WBC: 4.6 10*3/uL (ref 4.0–10.5)
nRBC: 0 % (ref 0.0–0.2)

## 2023-05-04 LAB — URINALYSIS, ROUTINE W REFLEX MICROSCOPIC
Bilirubin Urine: NEGATIVE
Glucose, UA: NEGATIVE mg/dL
Hgb urine dipstick: NEGATIVE
Leukocytes,Ua: NEGATIVE
Nitrite: NEGATIVE
Protein, ur: NEGATIVE mg/dL
Specific Gravity, Urine: 1.014 (ref 1.005–1.030)
pH: 5.5 (ref 5.0–8.0)

## 2023-05-04 MED ORDER — KETOROLAC TROMETHAMINE 10 MG PO TABS: 10.0000 mg | ORAL_TABLET | Freq: Four times a day (QID) | ORAL | 0 refills | Status: AC | PRN

## 2023-05-04 NOTE — ED Provider Notes (Signed)
 [place] EMERGENCY DEPARTMENT AT [facility] Provider Note   CSN: [id number] Arrival date & time: 05/04/23  1146     History  No chief complaint on file.   Dustin Bruce is a 43 y.o. male.  HPI   43 year old male presents emergency department with complaints of pink-colored urine as well as left-sided flank pain.  Patient reports knowing pink color to his urine yesterday morning but thought it was related to something that he ate or drank.  Noted additional episode this morning as well as short few minute episode of left-sided flank pain prompting visit to the emergency department.  Denies history of similar symptoms in the past.  Denies of blood thinner use.  Denies any dysuria, urinary frequency/urgency, abdominal pain, blood thinner use.  No significant part of past medical history.  Home Medications Prior to Admission medications   Medication Sig Start Date End Date Taking? Authorizing Provider  ketorolac  (TORADOL ) 10 MG tablet Take 1 tablet (10 mg total) by mouth every 6 (six) hours as needed. 05/04/23  Yes Silver Wonda LABOR, PA      Allergies    Prednisone     Review of Systems   Review of Systems  All other systems reviewed and are negative.   Physical Exam Updated Vital Signs BP (!) 128/93 (BP Location: Right Arm)   Pulse 70   Temp 98.1 F (36.7 C)   Resp 16   SpO2 100%  Physical Exam Vitals and nursing note reviewed.  Constitutional:      General: He is not in acute distress.    Appearance: He is well-developed.  HENT:     Head: Normocephalic and atraumatic.  Eyes:     Conjunctiva/sclera: Conjunctivae normal.  Cardiovascular:     Rate and Rhythm: Normal rate and regular rhythm.     Heart sounds: No murmur heard. Pulmonary:     Effort: Pulmonary effort is normal. No respiratory distress.     Breath sounds: Normal breath sounds. No wheezing, rhonchi or rales.  Abdominal:     Palpations: Abdomen is soft.     Tenderness: There is no abdominal  tenderness. There is no right CVA tenderness, left CVA tenderness or guarding.  Musculoskeletal:        General: No swelling.     Cervical back: Neck supple.  Skin:    General: Skin is warm and dry.     Capillary Refill: Capillary refill takes less than 2 seconds.  Neurological:     Mental Status: He is alert.  Psychiatric:        Mood and Affect: Mood normal.     ED Results / Procedures / Treatments   Labs (all labs ordered are listed, but only abnormal results are displayed) Labs Reviewed  URINALYSIS, ROUTINE W REFLEX MICROSCOPIC - Abnormal; Notable for the following components:      Result Value   Ketones, ur TRACE (*)    All other components within normal limits  BASIC METABOLIC PANEL - Abnormal; Notable for the following components:   Sodium 129 (*)    Chloride 97 (*)    All other components within normal limits  CBC WITH DIFFERENTIAL/PLATELET    EKG None  Radiology CT Renal Stone Study Result Date: 05/04/2023 CLINICAL DATA:  Abdominal/flank pain, stone suspected. EXAM: CT ABDOMEN AND PELVIS WITHOUT CONTRAST TECHNIQUE: Multidetector CT imaging of the abdomen and pelvis was performed following the standard protocol without IV contrast. RADIATION DOSE REDUCTION: This exam was performed according to the departmental  dose-optimization program which includes automated exposure control, adjustment of the mA and/or kV according to patient size and/or use of iterative reconstruction technique. COMPARISON:  None Available. FINDINGS: Lower chest: The lung bases are clear. No pleural effusion. The heart is normal in size. No pericardial effusion. Hepatobiliary: The liver is normal in size. Non-cirrhotic configuration. No suspicious mass. These is mild diffuse hepatic steatosis. No intrahepatic or extrahepatic bile duct dilation. No calcified gallstones. Normal gallbladder wall thickness. No pericholecystic inflammatory changes. Pancreas: Unremarkable. No pancreatic ductal dilatation or  surrounding inflammatory changes. Spleen: Within normal limits. No focal lesion. Adrenals/Urinary Tract: Adrenal glands are unremarkable. No suspicious renal mass within the limitations of this unenhanced exam. There is a 2 x 4 mm nonobstructing calculus in the left kidney lower pole. No other nephroureterolithiasis on either side. No obstructive uropathy on either side. Unremarkable urinary bladder. Stomach/Bowel: There is a small sliding hiatal hernia. No disproportionate dilation of the small or large bowel loops. No evidence of abnormal bowel wall thickening or inflammatory changes. The appendix was not visualized; however there is no acute inflammatory process in the right lower quadrant. Vascular/Lymphatic: No ascites or pneumoperitoneum. No abdominal or pelvic lymphadenopathy, by size criteria. No aneurysmal dilation of the major abdominal arteries. Reproductive: Normal size prostate. Symmetric seminal vesicles. Other: The visualized soft tissues and abdominal wall are unremarkable. Musculoskeletal: No suspicious osseous lesions. IMPRESSION: *There is a 2 x 4 mm nonobstructing calculus in the left kidney lower pole calyx. No other nephroureterolithiasis or obstructive uropathy on either side. *Multiple other nonacute observations, as described above. Electronically Signed   By: Ree Molt M.D.   On: 05/04/2023 14:33    Procedures Procedures    Medications Ordered in ED Medications - No data to display  ED Course/ Medical Decision Making/ A&P                                 Medical Decision Making Amount and/or Complexity of Data Reviewed Labs: ordered. Radiology: ordered.  Risk Prescription drug management.   This patient presents to the ED for concern of hematuria, left flank pain, this involves an extensive number of treatment options, and is a complaint that carries with it a high risk of complications and morbidity.  The differential diagnosis includes malignancy,  nephrolithiasis, pyelonephritis, cystitis, coagulopathy other   Co morbidities that complicate the patient evaluation  See HPI   Additional history obtained:  Additional history obtained from EMR External records from outside source obtained and reviewed including hospital records   Lab Tests:  I Ordered, and personally interpreted labs.  The pertinent results include: No leukocytosis.  No evidence of anemia.  Platelets within range.  Mild hyponatremia 129, hypochloremia of 97 otherwise, electrolytes within normal limits.  No renal dysfunction.  UA with trace ketones but otherwise unremarkable.   Imaging Studies ordered:  I ordered imaging studies including CT renal stone study I independently visualized and interpreted imaging which showed 2.4 mm nonobstructing calculus left kidney lower pole.  Sliding hiatal hernia.  No other acute abnormalities. I agree with the radiologist interpretation  Cardiac Monitoring: / EKG:  The patient was maintained on a cardiac monitor.  I personally viewed and interpreted the cardiac monitored which showed an underlying rhythm of: Sinus rhythm   Consultations Obtained:  N/a   Problem List / ED Course / Critical interventions / Medication management  Hematuria, left flank pain Reevaluation of the patient after these  medicines showed that the patient stayed the same I have reviewed the patients home medicines and have made adjustments as needed   Social Determinants of Health:  Denies tobacco, licit drug use.   Test / Admission - Considered:  Hematuria, flank pain Vitals signs within normal range and stable throughout visit. Laboratory/imaging studies significant for: See above 43 year old male presents emergency department with 2 episodes of pink-colored urine as well as brief 2 to 3-minute episode of left-sided flank pain.  On exam, no abdominal tenderness or CVA tenderness.  For patient reportedly was having pain earlier located in  left CVA region.  UA without obvious hematuria.  Patient without obvious anemia.  CT imaging concerning for 2 x 3 mm nonobstructing calculus left lower pole of kidney.  Suspect the patient's symptoms of brief episode of left-sided flank pain most likely secondary to said kidney stone despite not causing obstruction at this time.  Suspect brief episode of hematuria likely related to mechanical trauma due to movement of the stone.  No evidence of suspicious mass on CT imaging concerning for malignancy.  UA without obvious infection.  Will treat patient's symptoms with any exacerbation of pain with Toradol  and recommend follow-up with urology in the outpatient setting for further assessment/evaluation.  Treatment plan discussed length with patient and he acknowledged understanding was agreeable to said plan.  Patient overall well-appearing, afebrile in no acute distress. Worrisome signs and symptoms were discussed with the patient, and the patient acknowledged understanding to return to the ED if noticed. Patient was stable upon discharge.          Final Clinical Impression(s) / ED Diagnoses Final diagnoses:  Left flank pain  Nephrolithiasis    Rx / DC Orders ED Discharge Orders          Ordered    ketorolac  (TORADOL ) 10 MG tablet  Every 6 hours PRN        05/04/23 1516              Silver Wonda LABOR, [place] 05/04/23 1541    Lenor Hollering, MD 05/09/23 1332

## 2023-05-04 NOTE — Discharge Instructions (Signed)
 As discussed, suspect your symptoms are likely secondary to left-sided kidney stone.  It is currently not causing obstruction but I suspect that it has moved which was the cause of your left-sided flank pain as well as intermittent pink-colored urine.  Will send you home with medicine to take as needed for pain.  Will recommend follow-up with urology in the outpatient setting for reassessment.  Attaches her number to call to schedule an appointment.  Please do not hesitate to return to the emergency department if the worrisome signs and symptoms we discussed become apparent.

## 2023-05-04 NOTE — ED Triage Notes (Signed)
 Notice blood in urine (pink tint) yesterday Very slight pain in left flank

## 2024-01-17 ENCOUNTER — Other Ambulatory Visit: Admitting: *Deleted

## 2024-01-17 DIAGNOSIS — Z125 Encounter for screening for malignant neoplasm of prostate: Secondary | ICD-10-CM

## 2024-01-17 NOTE — Progress Notes (Signed)
 Patient: Dustin Bruce           Date of Birth: 1981/03/29           MRN: [id number] Visit Date: 01/17/2024 PCP: Tanda Bleacher, MD  Prostate Cancer Screening Date of last physical exam:  (1 year ago) Date of last rectal exam:  (Never) Have you ever had any of the following?: None Have you ever had or been told you have an allergy to latex products?: No Are you currently taking any natural prostate preparations?: No Are you currently experiencing any urinary symptoms?: No  Prostate Exam Exam not completed. PSA Blood test only completed.  Patient's History Patient Active Problem List   Diagnosis Date Noted   Acne vulgaris 12/17/2021   Alopecia areata 12/17/2021   Environmental allergies 09/07/2017   Past Medical History:  Diagnosis Date   Alopecia     Family History  Problem Relation Age of Onset   Cancer Neg Hx    Heart disease Neg Hx    Hypertension Neg Hx    Diabetes Neg Hx    Stroke Neg Hx     Social History   Occupational History   Not on file  Tobacco Use   Smoking status: Never   Smokeless tobacco: Never  Substance and Sexual Activity   Alcohol use: Yes    Comment: social   Drug use: No   Sexual activity: Not on file

## 2024-01-17 NOTE — Patient Instructions (Signed)
 SABRA

## 2024-01-21 LAB — PSA: Prostate Specific Ag, Serum: 1.4 ng/mL (ref 0.0–4.0)

## 2024-01-21 LAB — SPECIMEN STATUS REPORT

## 2024-01-23 ENCOUNTER — Telehealth: Payer: Self-pay

## 2024-01-23 NOTE — Telephone Encounter (Signed)
Left message on voicemail requesting a return call. Attempted to contact patient regarding lab results.  ?

## 2024-01-24 ENCOUNTER — Telehealth: Payer: Self-pay

## 2024-01-24 NOTE — Telephone Encounter (Signed)
 Patient informed PSA results, 1.4, which is slightly elevated per Dr. Griselda algorithm, which states males <50 years, should have PSA <1. Discussed with patient. Patient verbalized understanding. Per patient's request, results forwarded to Dr. Raguel Blush.
